# Patient Record
Sex: Male | Born: 2000 | Race: White | Hispanic: No | Marital: Single | Smoking: Current every day smoker
Health system: Southern US, Community
[De-identification: ages and names within clinical notes are randomized; demographics above are authoritative.]

## PROBLEM LIST (undated history)

## (undated) DIAGNOSIS — L729 Follicular cyst of the skin and subcutaneous tissue, unspecified: Secondary | ICD-10-CM

## (undated) DIAGNOSIS — R011 Cardiac murmur, unspecified: Secondary | ICD-10-CM

## (undated) DIAGNOSIS — F909 Attention-deficit hyperactivity disorder, unspecified type: Secondary | ICD-10-CM

## (undated) DIAGNOSIS — F419 Anxiety disorder, unspecified: Secondary | ICD-10-CM

## (undated) HISTORY — DX: Cardiac murmur, unspecified: R01.1

## (undated) HISTORY — DX: Attention-deficit hyperactivity disorder, unspecified type: F90.9

## (undated) HISTORY — DX: Anxiety disorder, unspecified: F41.9

---
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
## 2020-06-24 IMAGING — US US ABDOMEN LIMITED
1 series · 14 of 25 positions shown · non-contrast
Comparison: None.

CLINICAL DATA: Right upper quadrant pain for 1 day

EXAM:
ULTRASOUND ABDOMEN LIMITED RIGHT UPPER QUADRANT

[Series 1: us abdomen limited · 14 of 50 slices shown]
[im 1/50]
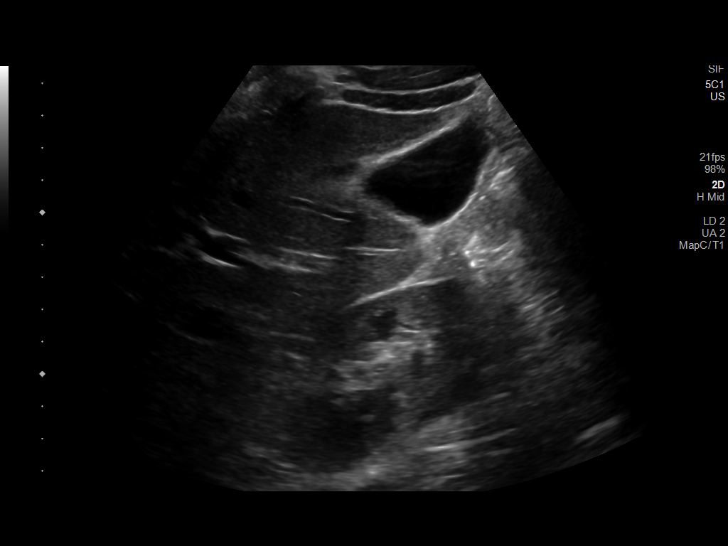
[im 5/50]
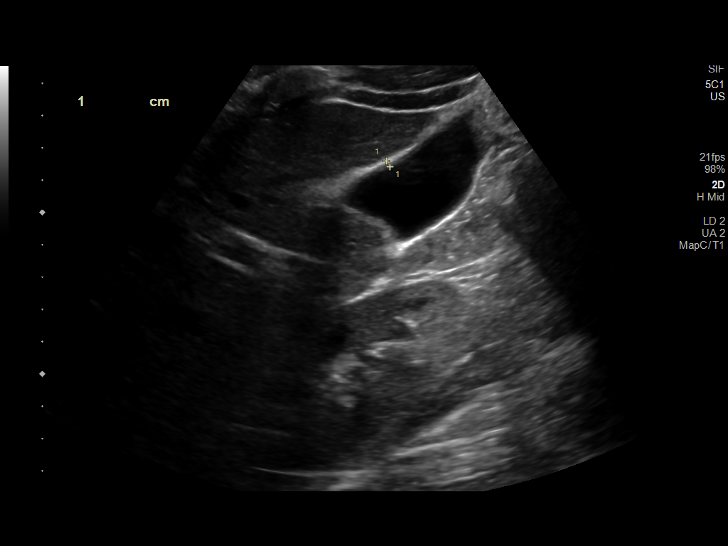
[im 9/50]
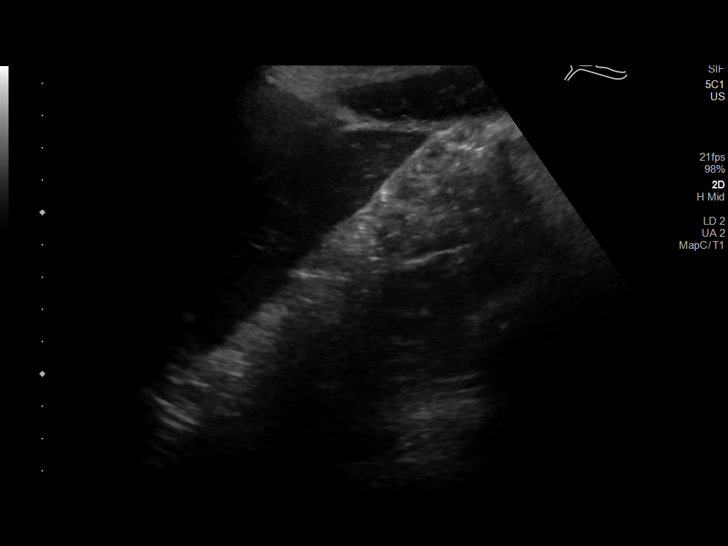
[im 13/50]
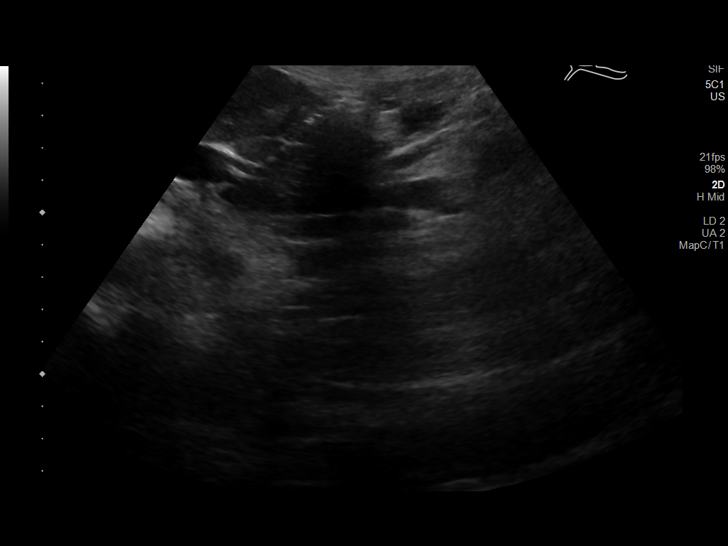
[im 17/50]
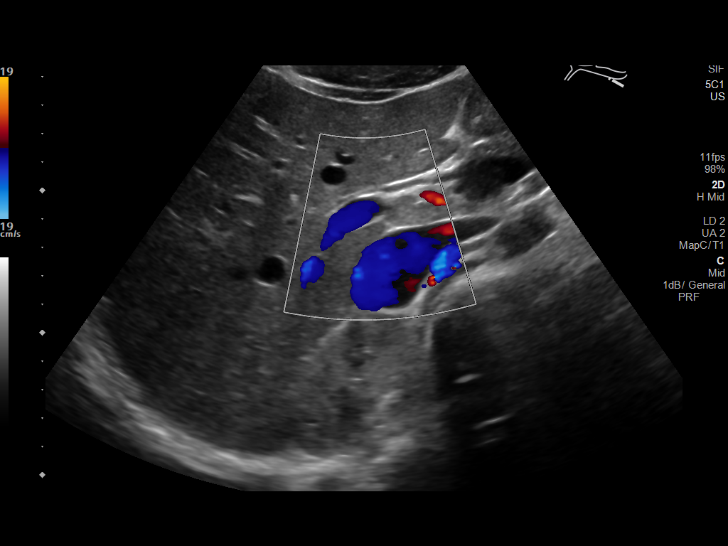
[im 19/50]
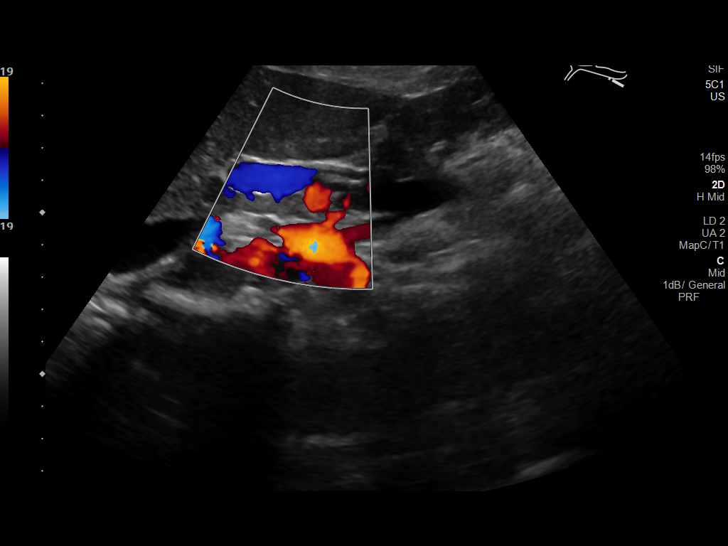
[im 23/50]
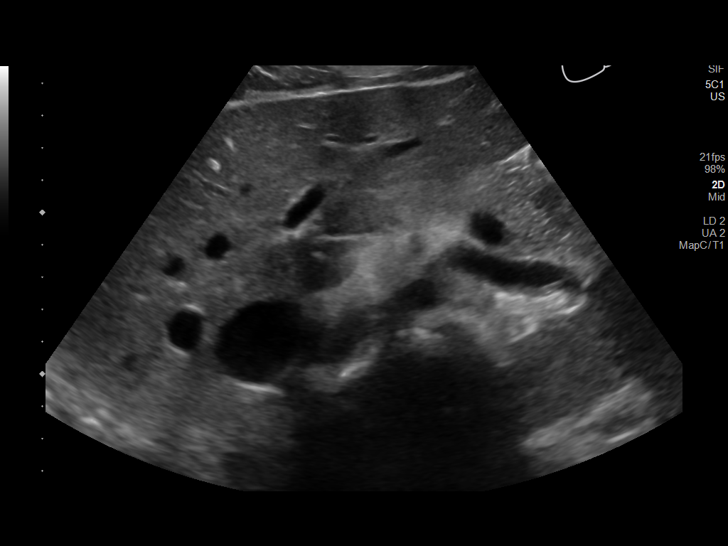
[im 27/50]
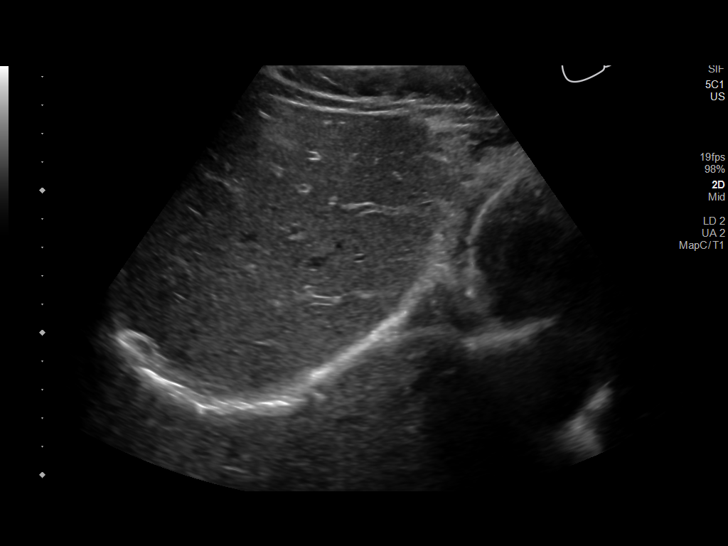
[im 31/50]
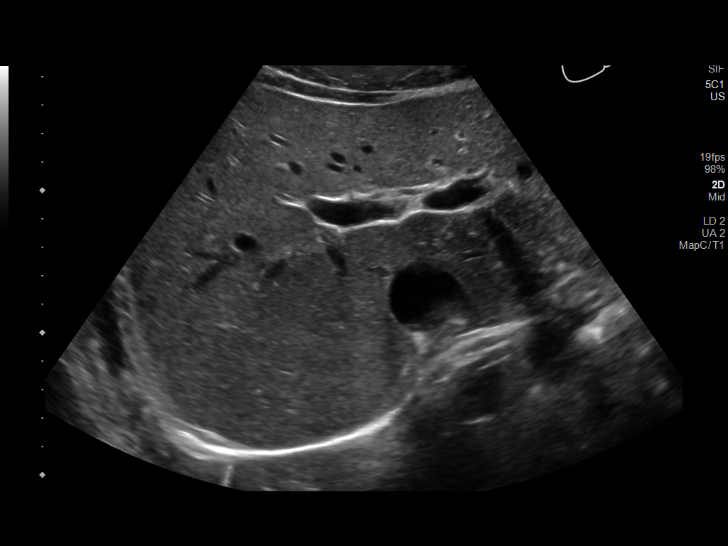
[im 33/50]
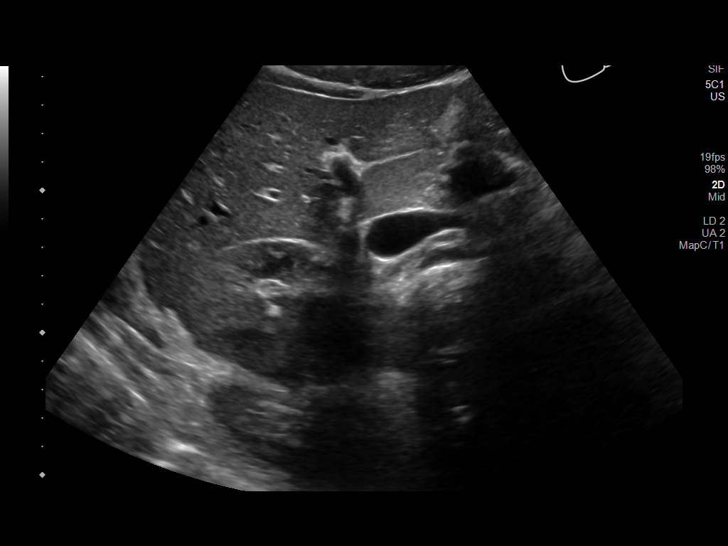
[im 37/50]
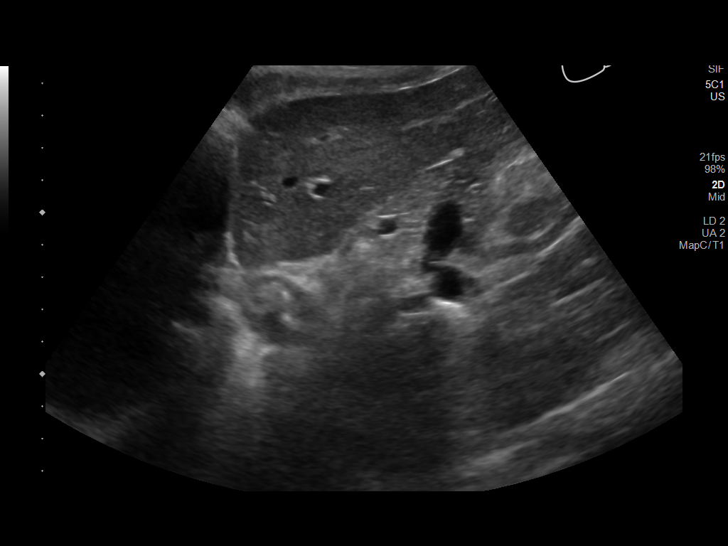
[im 41/50]
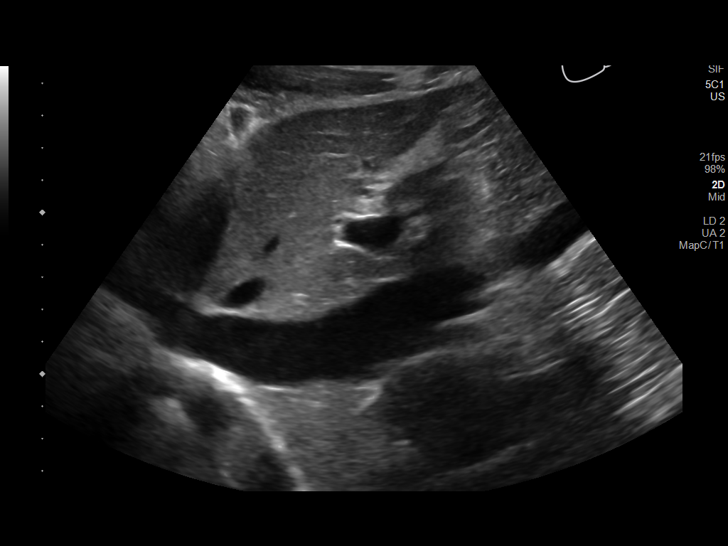
[im 45/50]
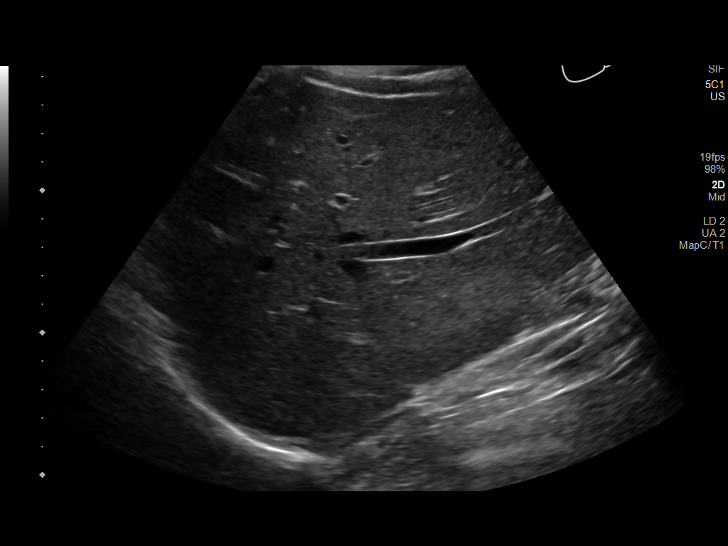
[im 50/50]
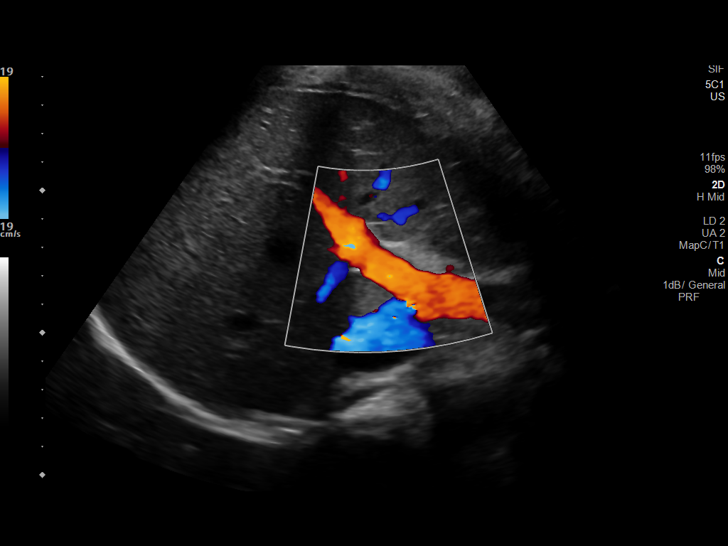

[14 of 25 positions shown; findings below may reference images not displayed]

FINDINGS: Gallbladder:

No gallstones or wall thickening visualized. No sonographic Murphy
sign noted by sonographer.

Common bile duct:

Diameter: 3 mm

Liver:

No focal lesion identified. Within normal limits in parenchymal
echogenicity. Portal vein is patent on color Doppler imaging with
normal direction of blood flow towards the liver.

Other: None.
IMPRESSION: Negative right upper quadrant abdominal ultrasound

## 2022-08-02 ENCOUNTER — Ambulatory Visit: Payer: Medicaid Other | Admitting: Family Medicine

## 2023-08-06 ENCOUNTER — Ambulatory Visit: Payer: Medicaid Other | Admitting: Family Medicine

## 2023-11-23 ENCOUNTER — Observation Stay (HOSPITAL_COMMUNITY)
Admission: EM | Admit: 2023-11-23 | Discharge: 2023-11-24 | Disposition: A | Discharge status: Home / Self Care | Attending: General Surgery | Admitting: General Surgery

## 2023-11-23 ENCOUNTER — Emergency Department (HOSPITAL_COMMUNITY)

## 2023-11-23 ENCOUNTER — Other Ambulatory Visit: Payer: Self-pay

## 2023-11-23 ENCOUNTER — Encounter (HOSPITAL_COMMUNITY): Payer: Self-pay | Admitting: Emergency Medicine

## 2023-11-23 DIAGNOSIS — Y990 Civilian activity done for income or pay: Secondary | ICD-10-CM | POA: Diagnosis not present

## 2023-11-23 DIAGNOSIS — W1789XA Other fall from one level to another, initial encounter: Secondary | ICD-10-CM | POA: Diagnosis not present

## 2023-11-23 DIAGNOSIS — S0083XA Contusion of other part of head, initial encounter: Principal | ICD-10-CM | POA: Insufficient documentation

## 2023-11-23 DIAGNOSIS — W19XXXA Unspecified fall, initial encounter: Secondary | ICD-10-CM

## 2023-11-23 DIAGNOSIS — F17210 Nicotine dependence, cigarettes, uncomplicated: Secondary | ICD-10-CM | POA: Insufficient documentation

## 2023-11-23 DIAGNOSIS — S301XXA Contusion of abdominal wall, initial encounter: Secondary | ICD-10-CM | POA: Diagnosis not present

## 2023-11-23 DIAGNOSIS — S0990XA Unspecified injury of head, initial encounter: Secondary | ICD-10-CM | POA: Diagnosis present

## 2023-11-23 LAB — COMPREHENSIVE METABOLIC PANEL WITH GFR
ALT: 38 U/L (ref 0–44)
AST: 32 U/L (ref 15–41)
Albumin: 3.9 g/dL (ref 3.5–5.0)
Alkaline Phosphatase: 48 U/L (ref 38–126)
Anion gap: 10 (ref 5–15)
BUN: 8 mg/dL (ref 6–20)
CO2: 21 mmol/L — ABNORMAL LOW (ref 22–32)
Calcium: 9 mg/dL (ref 8.9–10.3)
Chloride: 106 mmol/L (ref 98–111)
Creatinine, Ser: 0.9 mg/dL (ref 0.61–1.24)
GFR, Estimated: >60 mL/min (ref >60)
Glucose, Bld: 118 mg/dL — ABNORMAL HIGH (ref 70–99)
Potassium: 3.8 mmol/L (ref 3.5–5.1)
Sodium: 137 mmol/L (ref 135–145)
Total Bilirubin: 0.8 mg/dL (ref 0.0–1.2)
Total Protein: 6.5 g/dL (ref 6.5–8.1)

## 2023-11-23 LAB — I-STAT CHEM 8, ED
BUN: 8 mg/dL (ref 6–20)
Calcium, Ion: 1.07 mmol/L — ABNORMAL LOW (ref 1.15–1.40)
Chloride: 103 mmol/L (ref 98–111)
Creatinine, Ser: 1 mg/dL (ref 0.61–1.24)
Glucose, Bld: 117 mg/dL — ABNORMAL HIGH (ref 70–99)
HCT: 47 % (ref 39.0–52.0)
Hemoglobin: 16 g/dL (ref 13.0–17.0)
Potassium: 3.7 mmol/L (ref 3.5–5.1)
Sodium: 139 mmol/L (ref 135–145)
TCO2: 24 mmol/L (ref 22–32)

## 2023-11-23 LAB — ETHANOL: Alcohol, Ethyl (B): <15 mg/dL (ref <15)

## 2023-11-23 LAB — SAMPLE TO BLOOD BANK

## 2023-11-23 LAB — URINALYSIS, ROUTINE W REFLEX MICROSCOPIC
Bilirubin Urine: NEGATIVE
Glucose, UA: NEGATIVE mg/dL
Hgb urine dipstick: NEGATIVE
Ketones, ur: NEGATIVE mg/dL
Leukocytes,Ua: NEGATIVE
Nitrite: NEGATIVE
Protein, ur: NEGATIVE mg/dL
Specific Gravity, Urine: 1.008 (ref 1.005–1.030)
pH: 7 (ref 5.0–8.0)

## 2023-11-23 LAB — CBC
HCT: 46.6 % (ref 39.0–52.0)
Hemoglobin: 15.7 g/dL (ref 13.0–17.0)
MCH: 30.7 pg (ref 26.0–34.0)
MCHC: 33.7 g/dL (ref 30.0–36.0)
MCV: 91.2 fL (ref 80.0–100.0)
Platelets: 232 K/uL (ref 150–400)
RBC: 5.11 MIL/uL (ref 4.22–5.81)
RDW: 12.7 % (ref 11.5–15.5)
WBC: 13.2 K/uL — ABNORMAL HIGH (ref 4.0–10.5)
nRBC: 0 % (ref 0.0–0.2)

## 2023-11-23 LAB — PROTIME-INR
INR: 1 (ref 0.8–1.2)
Prothrombin Time: 13.4 s (ref 11.4–15.2)

## 2023-11-23 LAB — I-STAT CG4 LACTIC ACID, ED: Lactic Acid, Venous: 1.3 mmol/L (ref 0.5–1.9)

## 2023-11-23 LAB — HIV ANTIBODY (ROUTINE TESTING W REFLEX): HIV Screen 4th Generation wRfx: NONREACTIVE

## 2023-11-23 MED ORDER — SODIUM CHLORIDE 0.9 % IV SOLN: 250.0000 mL | INTRAVENOUS | Status: AC | PRN

## 2023-11-23 MED ORDER — SODIUM CHLORIDE 0.9% FLUSH: 3.0000 mL | INTRAVENOUS | Status: DC | PRN

## 2023-11-23 MED ORDER — LIDOCAINE-EPINEPHRINE (PF) 2 %-1:200000 IJ SOLN
20.0000 mL | Freq: Once | INTRAMUSCULAR | Status: AC
  Administered 2023-11-23: 20 mL
  Filled 2023-11-23: qty 20

## 2023-11-23 MED ORDER — MORPHINE SULFATE (PF) 4 MG/ML IV SOLN
4.0000 mg | Freq: Once | INTRAVENOUS | Status: AC
  Administered 2023-11-23: 4 mg via INTRAVENOUS
  Filled 2023-11-23: qty 1

## 2023-11-23 MED ORDER — METOPROLOL TARTRATE 5 MG/5ML IV SOLN: 5.0000 mg | Freq: Four times a day (QID) | INTRAVENOUS | Status: DC | PRN

## 2023-11-23 MED ORDER — OXYCODONE HCL 5 MG PO TABS
5.0000 mg | ORAL_TABLET | ORAL | Status: DC | PRN
  Administered 2023-11-23 – 2023-11-24 (×3): 5 mg via ORAL
  Filled 2023-11-23 (×3): qty 1

## 2023-11-23 MED ORDER — HYDROMORPHONE HCL 1 MG/ML IJ SOLN
0.5000 mg | INTRAMUSCULAR | Status: DC | PRN
  Administered 2023-11-23 – 2023-11-24 (×4): 0.5 mg via INTRAVENOUS
  Filled 2023-11-23: qty 0.5
  Filled 2023-11-23: qty 1
  Filled 2023-11-23 (×2): qty 0.5

## 2023-11-23 MED ORDER — POLYETHYLENE GLYCOL 3350 17 G PO PACK: 17.0000 g | PACK | Freq: Every day | ORAL | Status: DC | PRN

## 2023-11-23 MED ORDER — METHOCARBAMOL 1000 MG/10ML IJ SOLN: 500.0000 mg | Freq: Three times a day (TID) | INTRAMUSCULAR | Status: DC

## 2023-11-23 MED ORDER — CEFAZOLIN SODIUM-DEXTROSE 2-4 GM/100ML-% IV SOLN
2.0000 g | Freq: Once | INTRAVENOUS | Status: AC
  Administered 2023-11-23: 2 g via INTRAVENOUS
  Filled 2023-11-23: qty 100

## 2023-11-23 MED ORDER — TETANUS-DIPHTH-ACELL PERTUSSIS 5-2.5-18.5 LF-MCG/0.5 IM SUSY
0.5000 mL | PREFILLED_SYRINGE | Freq: Once | INTRAMUSCULAR | Status: AC
  Administered 2023-11-23: 0.5 mL via INTRAMUSCULAR
  Filled 2023-11-23: qty 0.5

## 2023-11-23 MED ORDER — IOHEXOL 350 MG/ML SOLN
75.0000 mL | Freq: Once | INTRAVENOUS | Status: AC | PRN
  Administered 2023-11-23: 75 mL via INTRAVENOUS

## 2023-11-23 MED ORDER — ONDANSETRON 4 MG PO TBDP: 4.0000 mg | ORAL_TABLET | Freq: Four times a day (QID) | ORAL | Status: DC | PRN

## 2023-11-23 MED ORDER — ONDANSETRON HCL 4 MG/2ML IJ SOLN: 4.0000 mg | Freq: Four times a day (QID) | INTRAMUSCULAR | Status: DC | PRN

## 2023-11-23 MED ORDER — METHOCARBAMOL 500 MG PO TABS
500.0000 mg | ORAL_TABLET | Freq: Three times a day (TID) | ORAL | Status: DC
  Administered 2023-11-23 – 2023-11-24 (×3): 500 mg via ORAL
  Filled 2023-11-23 (×3): qty 1

## 2023-11-23 MED ORDER — ACETAMINOPHEN 500 MG PO TABS
1000.0000 mg | ORAL_TABLET | Freq: Four times a day (QID) | ORAL | Status: DC
  Administered 2023-11-23 – 2023-11-24 (×5): 1000 mg via ORAL
  Filled 2023-11-23 (×5): qty 2

## 2023-11-23 MED ORDER — HYDROMORPHONE HCL 1 MG/ML IJ SOLN
1.0000 mg | Freq: Once | INTRAMUSCULAR | Status: AC
  Administered 2023-11-23: 1 mg via INTRAVENOUS
  Filled 2023-11-23: qty 1

## 2023-11-23 MED ORDER — SODIUM CHLORIDE 0.9% FLUSH
3.0000 mL | Freq: Two times a day (BID) | INTRAVENOUS | Status: DC
  Administered 2023-11-23 – 2023-11-24 (×2): 3 mL via INTRAVENOUS

## 2023-11-23 MED ORDER — DOCUSATE SODIUM 100 MG PO CAPS
100.0000 mg | ORAL_CAPSULE | Freq: Two times a day (BID) | ORAL | Status: DC
  Administered 2023-11-23 – 2023-11-24 (×2): 100 mg via ORAL
  Filled 2023-11-23 (×3): qty 1

## 2023-11-23 MED ORDER — ONDANSETRON HCL 4 MG/2ML IJ SOLN
4.0000 mg | Freq: Once | INTRAMUSCULAR | Status: AC
  Administered 2023-11-23: 4 mg via INTRAVENOUS
  Filled 2023-11-23: qty 2

## 2023-11-23 MED ORDER — HYDRALAZINE HCL 20 MG/ML IJ SOLN: 10.0000 mg | INTRAMUSCULAR | Status: DC | PRN

## 2023-11-23 NOTE — ED Notes (Signed)
 Pt to floor with transport. Floor aware.

## 2023-11-23 NOTE — H&P (Addendum)
 Activation and Reason: level II, fall  Primary Survey: airway intact, breath sounds intact, pulses intact  Alexander Cisneros is an 23 y.o. male.  HPI: 23 yo male was working at [employer] when something hit his head. He was up 12 ft and initial story was that he fell 12 ft though he is now uncertain. He has no abdominal pain.  Past Medical History:  Diagnosis Date   ADHD (attention deficit hyperactivity disorder)    no current med.   Anxiety    Benign skin cyst 12/2016   left cheek (face)   Heart murmur     Past Surgical History:  Procedure Laterality Date   CYST EXCISION Left 01/10/2017   Procedure: EXCISION CYST LEFT CHEEK;  Surgeon: Claudius Kaplan, MD;  Location: [facility];  Service: General;  Laterality: Left;    Family History  Problem Relation Age of Onset   Anxiety disorder Mother    Diabetes Maternal Grandmother    Hypertension Maternal Grandmother    Parkinson's disease Paternal Grandfather    Anxiety disorder Father     Social History:  reports that he has been smoking cigarettes. He has never used smokeless tobacco. He reports that he does not currently use drugs after having used the following drugs: Marijuana. He reports that he does not drink alcohol.  Allergies: No Known Allergies  Medications: I have reviewed the patient's current medications.  Results for orders placed or performed during the hospital encounter of 11/23/23 (from the past 48 hours)  Comprehensive metabolic panel     Status: Abnormal   Collection Time: 11/23/23  7:25 AM  Result Value Ref Range   Sodium 137 135 - 145 mmol/L   Potassium 3.8 3.5 - 5.1 mmol/L   Chloride 106 98 - 111 mmol/L   CO2 21 (L) 22 - 32 mmol/L   Glucose, Bld 118 (H) 70 - 99 mg/dL    Comment: Glucose reference range applies only to samples taken after fasting for at least 8 hours.   BUN 8 6 - 20 mg/dL   Creatinine, Ser 9.09 0.61 - 1.24 mg/dL   Calcium  9.0 8.9 - 10.3 mg/dL   Total Protein 6.5 6.5 -  8.1 g/dL   Albumin 3.9 3.5 - 5.0 g/dL   AST 32 15 - 41 U/L   ALT 38 0 - 44 U/L   Alkaline Phosphatase 48 38 - 126 U/L   Total Bilirubin 0.8 0.0 - 1.2 mg/dL   GFR, Estimated >39 >39 mL/min    Comment: (NOTE) Calculated using the CKD-EPI Creatinine Equation (2021)    Anion gap 10 5 - 15    Comment: Performed at [facility], [address] [place], [place] [address]  CBC     Status: Abnormal   Collection Time: 11/23/23  7:25 AM  Result Value Ref Range   WBC 13.2 (H) 4.0 - 10.5 K/uL   RBC 5.11 4.22 - 5.81 MIL/uL   Hemoglobin 15.7 13.0 - 17.0 g/dL   HCT 53.3 60.9 - 47.9 %   MCV 91.2 80.0 - 100.0 fL   MCH 30.7 26.0 - 34.0 pg   MCHC 33.7 30.0 - 36.0 g/dL   RDW 87.2 88.4 - 84.4 %   Platelets 232 150 - 400 K/uL   nRBC 0.0 0.0 - 0.2 %    Comment: Performed at [facility], [address], [place], [place] [address]  Ethanol     Status: None   Collection Time: 11/23/23  7:25 AM  Result Value Ref Range   Alcohol, Ethyl (B) <15 <15 mg/dL    Comment: (NOTE) For medical purposes only. Performed at [facility], [address] [place], [place] [address]   Urinalysis, Routine w reflex microscopic -Urine, Clean Catch     Status: Abnormal   Collection Time: 11/23/23  7:25 AM  Result Value Ref Range   Color, Urine STRAW (A) YELLOW   APPearance CLEAR CLEAR   Specific Gravity, Urine 1.008 1.005 - 1.030   pH 7.0 5.0 - 8.0   Glucose, UA NEGATIVE NEGATIVE mg/dL   Hgb urine dipstick NEGATIVE NEGATIVE   Bilirubin Urine NEGATIVE NEGATIVE   Ketones, ur NEGATIVE NEGATIVE mg/dL   Protein, ur NEGATIVE NEGATIVE mg/dL   Nitrite NEGATIVE NEGATIVE   Leukocytes,Ua NEGATIVE NEGATIVE    Comment: Performed at [facility], [address], [place], [place] [address]  Protime-INR     Status: None   Collection Time: 11/23/23  7:25 AM  Result Value Ref Range   Prothrombin Time 13.4 11.4 - 15.2 seconds   INR 1.0 0.8 - 1.2    Comment: (NOTE) INR goal varies based on device and  disease states. Performed at [facility], [address] [place], [place] [address]   Sample to Blood Bank     Status: None   Collection Time: 11/23/23  7:31 AM  Result Value Ref Range   Blood Bank Specimen SAMPLE AVAILABLE FOR TESTING    Sample Expiration      11/26/2023,2359 Performed at [facility], [address], [place], [place] [address]   I-Stat Chem 8, ED     Status: Abnormal   Collection Time: 11/23/23  7:47 AM  Result Value Ref Range   Sodium 139 135 - 145 mmol/L   Potassium 3.7 3.5 - 5.1 mmol/L   Chloride 103 98 - 111 mmol/L   BUN 8 6 - 20 mg/dL   Creatinine, Ser 8.99 0.61 - 1.24 mg/dL   Glucose, Bld 882 (H) 70 - 99 mg/dL    Comment: Glucose reference range applies only to samples taken after fasting for at least 8 hours.   Calcium , Ion 1.07 (L) 1.15 - 1.40 mmol/L   TCO2 24 22 - 32 mmol/L   Hemoglobin 16.0 13.0 - 17.0 g/dL   HCT 52.9 60.9 - 47.9 %  I-Stat Lactic Acid, ED     Status: None   Collection Time: 11/23/23  7:47 AM  Result Value Ref Range   Lactic Acid, Venous 1.3 0.5 - 1.9 mmol/L      PE Blood pressure (!) 150/84, pulse (!) 53, temperature 98.4 F (36.9 C), temperature source Oral, resp. rate 16, SpO2 100%. Constitutional: NAD; conversant; forehead laceration repaired Eyes: Moist conjunctiva; no lid lag; anicteric; PERRL Neck: Trachea midline; no thyromegaly, no cervicalgia Lungs: Normal respiratory effort; no tactile fremitus CV: RRR; no palpable thrills; no pitting edema GI: Abd soft, NT, ND; no palpable hepatosplenomegaly MSK: unable to assess gait; no clubbing/cyanosis Psychiatric: Appropriate affect; alert and oriented x3 Lymphatic: No palpable cervical or axillary lymphadenopathy   Assessment/Plan: 23 yo male hit in head/fall 12 ft  Front bone fx - pain control, follow up with [facility] in 2 weeks Hepatoduodenal ligament hematoma - observe, clear liquids, repeat CBC in am. I reviewed Ct scan showing hematoma just inferior to  liver and medial to gallbladder, no extrav, small fluid in pelvis  FEN- clear liquids VTE- hold for hematoma ID- no issues  Dispo- obs on med surg   Procedures: none  I reviewed last 24 h vitals and pain scores, last 48 h intake and output, last 24 h labs and trends, and last 24 h imaging results.  This care required high  level of medical decision making.   Herlene Righter Kash [place] 11/23/2023, 12:31 PM

## 2023-11-23 NOTE — ED Triage Notes (Signed)
 Pt had an unwitnessed fall. Pt was supposed to be up about 12 feet up but unsure were he fell from. Pt had LOC.

## 2023-11-23 NOTE — Consult Note (Signed)
 Reason for Consult: Skull fracture, subdural hematoma, subarachnoid hemorrhage Referring Physician: Dr. Charlyn Netter Alexander Cisneros is an 23 y.o. male.  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  The patient was brought to [facility].  A head CT was obtained which demonstrated a right frontal skull fracture with a small amount of subdural hematoma, subarachnoid hemorrhage and pneumocephaly.  A neurosurgical consultation was requested.  He has also had an abnormal CT of the abdomen pelvis and has been seen by trauma surgery.  Presently the patient is accompanied by family members and is in the process of having his forehead stitched.  His only complaint is of generalized neck soreness.  He denies numbness, tingling, seizures, weakness, etc.  He is not anticoagulated.  Past Medical History:  Diagnosis Date   ADHD (attention deficit hyperactivity disorder)    no current med.   Anxiety    Benign skin cyst 12/2016   left cheek (face)   Heart murmur     Past Surgical History:  Procedure Laterality Date   CYST EXCISION Left 01/10/2017   Procedure: EXCISION CYST LEFT CHEEK;  Surgeon: Claudius Kaplan, MD;  Location: [facility];  Service: General;  Laterality: Left;    Family History  Problem Relation Age of Onset   Anxiety disorder Mother    Diabetes Maternal Grandmother    Hypertension Maternal Grandmother    Parkinson's disease Paternal Grandfather    Anxiety disorder Father     Social History:  reports that he has been smoking cigarettes. He has never used smokeless tobacco. He reports that he does not currently use drugs after having used the following drugs: Marijuana. He reports that he does not drink alcohol.  Allergies: No Known Allergies  Medications: I have reviewed the patient's current medications. Prior to  Admission: (Not in a hospital admission)  Scheduled:  sodium chloride  flush  3 mL Intravenous Q12H   Continuous:  sodium chloride      PRN:sodium chloride , sodium chloride  flush Anti-infectives (From admission, onward)    Start     Dose/Rate Route Frequency Ordered Stop   11/23/23 0900  ceFAZolin  (ANCEF ) IVPB 2g/100 mL premix        2 g 200 mL/hr over 30 Minutes Intravenous  Once 11/23/23 0856 11/23/23 0943        Results for orders placed or performed during the hospital encounter of 11/23/23 (from the past 48 hours)  Comprehensive metabolic panel     Status: Abnormal   Collection Time: 11/23/23  7:25 AM  Result Value Ref Range   Sodium 137 135 - 145 mmol/L   Potassium 3.8 3.5 - 5.1 mmol/L   Chloride 106 98 - 111 mmol/L   CO2 21 (L) 22 - 32 mmol/L   Glucose, Bld 118 (H) 70 - 99 mg/dL    Comment: Glucose reference range applies only to samples taken after fasting for at least 8 hours.   BUN 8 6 - 20 mg/dL   Creatinine, Ser 9.09 0.61 - 1.24 mg/dL   Calcium  9.0 8.9 - 10.3 mg/dL   Total Protein 6.5 6.5 - 8.1 g/dL   Albumin 3.9 3.5 - 5.0 g/dL   AST 32 15 - 41 U/L   ALT 38 0 - 44 U/L   Alkaline Phosphatase 48 38 - 126 U/L   Total Bilirubin 0.8  0.0 - 1.2 mg/dL   GFR, Estimated >39 >39 mL/min    Comment: (NOTE) Calculated using the CKD-EPI Creatinine Equation (2021)    Anion gap 10 5 - 15    Comment: Performed at [facility], [address], [place], [place] [address]  CBC     Status: Abnormal   Collection Time: 11/23/23  7:25 AM  Result Value Ref Range   WBC 13.2 (H) 4.0 - 10.5 K/uL   RBC 5.11 4.22 - 5.81 MIL/uL   Hemoglobin 15.7 13.0 - 17.0 g/dL   HCT 53.3 60.9 - 47.9 %   MCV 91.2 80.0 - 100.0 fL   MCH 30.7 26.0 - 34.0 pg   MCHC 33.7 30.0 - 36.0 g/dL   RDW 87.2 88.4 - 84.4 %   Platelets 232 150 - 400 K/uL   nRBC 0.0 0.0 - 0.2 %    Comment: Performed at [facility], [address], [place], [place] [address]  Ethanol     Status: None   Collection  Time: 11/23/23  7:25 AM  Result Value Ref Range   Alcohol, Ethyl (B) <15 <15 mg/dL    Comment: (NOTE) For medical purposes only. Performed at [facility], [address], [place], [place] [address]   Urinalysis, Routine w reflex microscopic -Urine, Clean Catch     Status: Abnormal   Collection Time: 11/23/23  7:25 AM  Result Value Ref Range   Color, Urine STRAW (A) YELLOW   APPearance CLEAR CLEAR   Specific Gravity, Urine 1.008 1.005 - 1.030   pH 7.0 5.0 - 8.0   Glucose, UA NEGATIVE NEGATIVE mg/dL   Hgb urine dipstick NEGATIVE NEGATIVE   Bilirubin Urine NEGATIVE NEGATIVE   Ketones, ur NEGATIVE NEGATIVE mg/dL   Protein, ur NEGATIVE NEGATIVE mg/dL   Nitrite NEGATIVE NEGATIVE   Leukocytes,Ua NEGATIVE NEGATIVE    Comment: Performed at [facility], [address], [place], [place] [address]  Protime-INR     Status: None   Collection Time: 11/23/23  7:25 AM  Result Value Ref Range   Prothrombin Time 13.4 11.4 - 15.2 seconds   INR 1.0 0.8 - 1.2    Comment: (NOTE) INR goal varies based on device and disease states. Performed at [facility], [address] [place], [place] [address]   Sample to Blood Bank     Status: None   Collection Time: 11/23/23  7:31 AM  Result Value Ref Range   Blood Bank Specimen SAMPLE AVAILABLE FOR TESTING    Sample Expiration      11/26/2023,2359 Performed at [facility], [address] [place], [place] [address]   I-Stat Chem 8, ED     Status: Abnormal   Collection Time: 11/23/23  7:47 AM  Result Value Ref Range   Sodium 139 135 - 145 mmol/L   Potassium 3.7 3.5 - 5.1 mmol/L   Chloride 103 98 - 111 mmol/L   BUN 8 6 - 20 mg/dL   Creatinine, Ser 8.99 0.61 - 1.24 mg/dL   Glucose, Bld 882 (H) 70 - 99 mg/dL    Comment: Glucose reference range applies only to samples taken after fasting for at least 8 hours.   Calcium , Ion 1.07 (L) 1.15 - 1.40 mmol/L   TCO2 24 22 - 32 mmol/L   Hemoglobin 16.0 13.0 - 17.0 g/dL   HCT 52.9  60.9 - 47.9 %  I-Stat Lactic Acid, ED     Status:  None   Collection Time: 11/23/23  7:47 AM  Result Value Ref Range   Lactic Acid, Venous 1.3 0.5 - 1.9 mmol/L    CT CHEST ABDOMEN PELVIS W CONTRAST Result Date: 11/23/2023 CLINICAL DATA:  Unwitnessed fall. Patient may have been as high as 12 feet. Blunt poly trauma. EXAM: CT CHEST, ABDOMEN, AND PELVIS WITH CONTRAST TECHNIQUE: Multidetector CT imaging of the chest, abdomen and pelvis was performed following the standard protocol during bolus administration of intravenous contrast. RADIATION DOSE REDUCTION: This exam was performed according to the departmental dose-optimization program which includes automated exposure control, adjustment of the mA and/or kV according to patient size and/or use of iterative reconstruction technique. CONTRAST:  75mL OMNIPAQUE  IOHEXOL  350 MG/ML SOLN COMPARISON:  None Available. FINDINGS: CT CHEST FINDINGS Cardiovascular: The heart size is normal. No substantial pericardial effusion. Mediastinum/Nodes: Subtle soft tissue in the high anterior mediastinum with triangular configuration is consistent with thymic remnant. No evidence for mediastinal edema or hemorrhage. No mediastinal lymphadenopathy. There is no hilar lymphadenopathy. The esophagus has normal imaging features. There is no axillary lymphadenopathy. Lungs/Pleura: No evidence for pneumothorax or pleural effusion. Trace paraseptal emphysema noted right lung apex. No lung contusion. Dependent atelectasis noted in the lung bases, right greater than left. Musculoskeletal: No worrisome lytic or sclerotic osseous abnormality. No evidence for rib fracture. No clavicle or scapular fracture. No sternal fracture. No evidence for thoracic spine fracture CT ABDOMEN PELVIS FINDINGS Hepatobiliary: No evidence for liver laceration or contusion. Intrahepatic biliary duct prominence with mild periportal edema. There is no evidence for gallstones, gallbladder wall thickening, or  pericholecystic fluid. No intrahepatic or extrahepatic biliary dilation. Pancreas: No focal mass lesion. No dilatation of the main duct. No intraparenchymal cyst. No peripancreatic edema. Spleen: No splenomegaly. No suspicious focal mass lesion. Adrenals/Urinary Tract: No adrenal nodule or mass. Kidneys unremarkable. No evidence for hydroureter. The urinary bladder appears normal for the degree of distention. Stomach/Bowel: Stomach is unremarkable. No gastric wall thickening. No evidence of outlet obstruction. Duodenum is normally positioned as is the ligament of Treitz. No small bowel wall thickening. No small bowel dilatation. The terminal ileum is normal. The appendix is not well visualized, but there is no edema or inflammation in the region of the cecal tip to suggest appendicitis. No gross colonic mass. No colonic wall thickening. No gross colonic mass. No colonic wall thickening. Vascular/Lymphatic: No abdominal aortic aneurysm. No abdominal aortic atherosclerotic calcification. There is no gastrohepatic or hepatoduodenal ligament lymphadenopathy. No retroperitoneal or mesenteric lymphadenopathy. No pelvic sidewall lymphadenopathy. Reproductive: The prostate gland and seminal vesicles are unremarkable. Other: There is edema/hemorrhage in the region of the hepatoduodenal ligament in upper abdominal retroperitoneal space (see axial images 64 and 66 of series 6). 3.6 x 2.2 cm heterogeneous soft tissue density medial to the gallbladder on 69/6 could possibly be a lymph node although a contained area of hemorrhage could also have this appearance. (See coronal 59/9). Subtle edema is seen a in the aortocaval space on 72/6. No contrast extravasation in these regions to suggest active or ongoing bleeding at the time of imaging. There is a small amount of free fluid in the pelvis (image 115/6) with attenuation higher than would be expected for simple fluid raising the question of blood products. Musculoskeletal: No  evidence for an acute fracture in the bony anatomy of the pelvis. SI joints and symphysis pubis unremarkable. No evidence for lumbar spine or sacral fracture. IMPRESSION: 1. Edema/hemorrhage in the region of the hepatoduodenal ligament and upper abdominal retroperitoneal  space. 3.6 x 2.2 cm heterogeneous soft tissue density medial to the gallbladder suggests a contained area of hemorrhage. Enlarged lymph node a consideration but considered less likely. No contrast extravasation in these regions to suggest active or ongoing bleeding at the time of imaging. 2. Small amount of free fluid in the pelvis with attenuation higher than would be expected for simple fluid raising the question of blood products. 3. Intrahepatic biliary duct prominence with mild periportal edema. No evidence for liver laceration or contusion. No discernible perihepatic fluid. No evidence for splenic injury. 4. No evidence for an acute fracture in the bony anatomy of the chest, abdomen, or pelvis. 5. No evidence for pneumothorax or pleural effusion. No lung contusion. 6.  Emphysema (ICD10-J43.9). I discussed these findings by telephone with provider Dorn Dec at approximately 0845 hours on 11/23/2023. Electronically Signed   By: Camellia Candle M.D.   On: 11/23/2023 08:46   CT HEAD WO CONTRAST Addendum Date: 11/23/2023 ADDENDUM REPORT: 11/23/2023 08:45 ADDENDUM: Study discussed by telephone with provider JONATHAN GILLIAM on 11/23/2023 at 0830 hours. Electronically Signed   By: VEAR Hurst M.D.   On: 11/23/2023 08:45   Result Date: 11/23/2023 CLINICAL DATA:  23 year old male status post unwitnessed fall. Positive loss of consciousness. EXAM: CT HEAD WITHOUT CONTRAST TECHNIQUE: Contiguous axial images were obtained from the base of the skull through the vertex without intravenous contrast. RADIATION DOSE REDUCTION: This exam was performed according to the departmental dose-optimization program which includes automated exposure control,  adjustment of the mA and/or kV according to patient size and/or use of iterative reconstruction technique. COMPARISON:  Brain MRI 03/22/2020. CT face and cervical spine today reported separately. FINDINGS: Brain: Trace pneumocephalus deep to the right frontal sinus. Trace right anterior inferior frontal gyrus linear subarachnoid hemorrhage. Series 3, image 17 and series 5, image 63). No definite hemorrhagic contusion. Trace extra-axial blood along the anterior falx which could be in the subarachnoid or subdural space. And there is evidence of a 2 mm right anterior convexity subdural hematoma also on series 3, image 21. No other acute intracranial hemorrhage. Normal underlying cerebral volume. No midline shift, ventriculomegaly, mass effect, evidence of mass lesion, intracranial hemorrhage or evidence of cortically based acute infarction. Normal basilar cisterns. Normal gray-white differentiation outside of the right anterior inferior frontal gyrus. Vascular: No suspicious intracranial vascular hyperdensity. Skull: Linear fracture through the right frontal bone including through and through the right frontal sinus, frontoethmoidal recess (series 4, image 31). Fracture tracks cephalad toward the vertex. Underlying normal bone mineralization. No other No acute osseous abnormality identified. Sinuses/Orbits: Hemorrhage in the right frontal sinus, frontoethmoidal recess. Other paranasal sinuses are stable and well aerated. Tympanic cavities and mastoids appear clear. Other: Right forehead soft tissue injury including laceration with trace soft tissue gas. Underlying nondepressed right frontal bone fracture. Orbits soft tissues appear negative. Traumatic Brain Injury Risk Stratification Skull Fracture: Nondisplaced - Moderate/mBIG 2 Subdural Hematoma (SDH): <65mm - mBIG 1 (Right anterior frontal convexity, para falcine) Subarachnoid Hemorrhage [facility]): trace (up to 2 sulci) - Low/mBIG 1 Epidural Hematoma (EDH): No - Low/mBIG  1 Cerebral contusion, intra-axial, intraparenchymal Hemorrhage (IPH): No Intraventricular Hemorrhage (IVH): No - Low/mBIG 1 Midline Shift > 1mm or Edema/effacement of sulci/vents: No - Low/mBIG 1 ---------------------------------------------------- IMPRESSION: 1. Positive for nondepressed anterior right frontal bone fracture which is through and through the right frontal sinus and frontoethmoidal recess. Trace pneumocephalus. Small volume right anterior convexity and para falcine subdural hematoma (less than 4 mm) And trace right anterior  frontal lobe Subarachnoid Hemorrhage. 2. No intracranial mass effect or midline shift. 3. CT face and cervical spine are reported separately. Electronically Signed: By: VEAR Hurst M.D. On: 11/23/2023 08:26   CT ANGIO NECK W OR WO CONTRAST Result Date: 11/23/2023 CLINICAL DATA:  23 year old male status post unwitnessed fall. Positive loss of consciousness. EXAM: CT ANGIOGRAPHY HEAD AND NECK TECHNIQUE: Multidetector CT imaging of the head and neck was performed using the standard protocol during bolus administration of intravenous contrast. Multiplanar CT image reconstructions and MIPs were obtained to evaluate the vascular anatomy. Carotid stenosis measurements (when applicable) are obtained utilizing NASCET criteria, using the distal internal carotid diameter as the denominator. RADIATION DOSE REDUCTION: This exam was performed according to the departmental dose-optimization program which includes automated exposure control, adjustment of the mA and/or kV according to patient size and/or use of iterative reconstruction technique. CONTRAST:  75mL OMNIPAQUE  IOHEXOL  350 MG/ML SOLN COMPARISON:  CT head, Cervical spine CT reported separately. FINDINGS: CTA NECK Skeleton: Reported separately today. Upper chest: Negative. Other neck: Nonvascular neck soft tissue spaces appear within normal limits. Right anterior scalp soft tissue injury in association with nondepressed skull fracture, see  comparison. Aortic arch: 4 vessel arch, left vertebral artery arises directly from the arch. No atherosclerosis. Right carotid system: Dense right subclavian contrast venous streak artifact partially obscures the right brachiocephalic artery bifurcation. Otherwise the cervical right carotid system appears patent and negative. Left carotid system: Patent and negative. Vertebral arteries: Proximal right subclavian artery detail mildly obscured. Right vertebral artery patent and negative. Left vertebral artery arises directly from the aortic arch, otherwise normal origin. Late entry of the left vertebral into the cervical transverse foramen (normal variant series 3, image 256). And slightly non dominant appearance of the left vertebral which is patent and negative to the skull base. CTA HEAD Posterior circulation: More codominant appearance of the distal vertebral arteries, mildly tortuous with otherwise normal V4 segments and vertebrobasilar junction. Patent basilar artery is mildly tortuous. Normal PICA origins. No basilar stenosis. Patent SCA and fetal type bilateral PCA origins. Small P1 segments. Bilateral PCA branches are within normal limits. Anterior circulation: Both ICA siphons are patent and normal. Normal posterior communicating artery origins. Bilateral carotid termini, MCA and ACA origins are patent and normal. Normal anterior communicating artery, bilateral ACA branches. MCA M1 segments and MCA trifurcations are patent without stenosis. Bilateral MCA branches are within normal limits. Venous sinuses: Patent. Anatomic variants: Left vertebral artery arises directly from the aortic arch and is mildly non dominant. Fetal type PCA origins. Review of the MIP images confirms the above findings Review of the MIP images confirms the above findings IMPRESSION: 1. Negative CTA Head and Neck. No traumatic vascular injury identified. 2. CT Head, Face and, Cervical spine arereported separately. Electronically Signed    By: VEAR Hurst M.D.   On: 11/23/2023 08:44   CT CERVICAL SPINE WO CONTRAST Result Date: 11/23/2023 CLINICAL DATA:  23 year old male status post unwitnessed fall. Positive loss of consciousness. EXAM: CT CERVICAL SPINE WITHOUT CONTRAST TECHNIQUE: Multidetector CT imaging of the cervical spine was performed without intravenous contrast. Multiplanar CT image reconstructions were also generated. RADIATION DOSE REDUCTION: This exam was performed according to the departmental dose-optimization program which includes automated exposure control, adjustment of the mA and/or kV according to patient size and/or use of iterative reconstruction technique. COMPARISON:  CT head and face today reported separately. FINDINGS: Alignment: Normal cervical lordosis. Cervicothoracic junction alignment is within normal limits. Bilateral posterior element alignment is within  normal limits. Skull base and vertebrae: Bone mineralization is within normal limits. Visualized skull base is intact. No atlanto-occipital dissociation. C1 and C2 appear intact and aligned. No osseous abnormality identified in the cervical spine. Soft tissues and spinal canal: No prevertebral fluid or swelling. No visible canal hematoma. Negative visible noncontrast neck soft tissues. Disc levels:  Negative. Upper chest: Negative visible noncontrast thoracic inlet. IMPRESSION: Negative CT appearance of the Cervical spine. Electronically Signed   By: VEAR Hurst M.D.   On: 11/23/2023 08:33   CT MAXILLOFACIAL WO CONTRAST Result Date: 11/23/2023 CLINICAL DATA:  23 year old male status post unwitnessed fall. Positive loss of consciousness. EXAM: CT MAXILLOFACIAL WITHOUT CONTRAST TECHNIQUE: Multidetector CT imaging of the maxillofacial structures was performed. Multiplanar CT image reconstructions were also generated. RADIATION DOSE REDUCTION: This exam was performed according to the departmental dose-optimization program which includes automated exposure control,  adjustment of the mA and/or kV according to patient size and/or use of iterative reconstruction technique. COMPARISON:  Head CT today.  Cervical spine CT reported separately. FINDINGS: Osseous: Mandible intact and normally located. Bilateral maxilla, zygoma, pterygoid, and nasal bones are intact. Visible central skull base intact. Linear through and through fracture of the right frontoethmoidal recess and frontal sinus on series 3, image 79, further detailed on head CT today. Orbits: Right orbital roof and orbital walls relatively spared despite the medial right frontal sinus fracture. Globes and intraorbital soft tissues appear intact and normal. Sinuses: Hemorrhage in the right frontal sinus and anterior right ethmoids. Otherwise well aerated bilaterally. Tympanic cavities and mastoids are clear. Soft tissues: Negative visible noncontrast larynx, pharynx, parapharyngeal spaces, retropharyngeal space, sublingual space, submandibular spaces, masticator and parotid spaces. Limited intracranial: Stable to that reported separately. IMPRESSION: 1. Redemonstration of Linear fracture through and through the right frontoethmoidal recess and frontal sinus. Right orbital walls relatively spared. See Head CT reported separately. 2. No other acute fracture or traumatic injury identified in the Face. Electronically Signed   By: VEAR Hurst M.D.   On: 11/23/2023 08:30   DG Pelvis Portable Result Date: 11/23/2023 CLINICAL DATA:  Unwitnessed fall. Patient may have been as high as 12 feet. EXAM: PORTABLE PELVIS 1-2 VIEWS COMPARISON:  None Available. FINDINGS: There is no evidence of pelvic fracture or diastasis. No pelvic bone lesions are seen. IMPRESSION: Negative. Electronically Signed   By: Camellia Candle M.D.   On: 11/23/2023 07:46   DG Chest Port 1 View Result Date: 11/23/2023 CLINICAL DATA:  Unwitnessed fall from potentially as high as 12 feet. EXAM: PORTABLE CHEST 1 VIEW COMPARISON:  02/26/2019 FINDINGS: The lungs are clear  without focal pneumonia, edema, pneumothorax or pleural effusion. The cardiopericardial silhouette is within normal limits for size. No discernible displaced acute rib fracture. Multiple tiny radiodense foreign bodies project over the left lung base, potentially external to the patient. IMPRESSION: 1. No acute cardiopulmonary findings. 2. Multiple tiny radiodense foreign bodies project over the left lung base, potentially external to the patient. Electronically Signed   By: Camellia Candle M.D.   On: 11/23/2023 07:46    ROS: As above he complains of neck pain Blood pressure (!) 147/90, pulse 64, temperature 98.1 F (36.7 C), resp. rate (!) 21, SpO2 100%. Estimated body mass index is 23.84 kg/m as calculated from the following:   Height as of 04/07/19: 5' 10 (1.778 m).   Weight as of 04/07/19: 75.4 kg.  Physical Exam  General: An alert and pleasant 23 year old white male in no apparent distress  HEENT: He is  having his right forehead laceration sutured.  Neck: Unremarkable  Thorax: Symmetric  Abdomen: Soft  Extremities: Unremarkable  Neurologic exam: The patient is alert and oriented x 3.  His vision and hearing are grossly normal.  His motor strength is 5/5 in his bilateral bicep, handgrip, gastrocnemius and dorsiflexors.  Cerebellar function is intact the rapid alternating movements of the upper extremities.  Sensory function is intact to light touch sensation all tested dermatomes bilaterally.  I reviewed the patient's head CT performed today.  It demonstrates a right frontal skull fracture through the right frontal sinus.  There is associated tiny subdural hematoma, subarachnoid hemorrhage, possible cerebral contusion and pneumocephaly.  There is some fluid in the right frontal sinus.  I reviewed the patient's cervical CT.  It is unremarkable.    Assessment/Plan: Skull fracture, tiny subarachnoid hemorrhage, subdural hematoma, cerebral contusion, pneumocephaly: I discussed situation  with the patient and his family.  This does not require any further imaging.  He should follow-up in my office in a week or 2 to well CSF fistula.  I have answered all their questions.  Cervicalgia: Since he complains of neck pain I would suggest a flexion-extension C-spine x-ray before discharge.  Abnormal CT of the abdomen and pelvis: Per trauma  Reyes JONETTA Budge 11/23/2023, 9:56 AM

## 2023-11-23 NOTE — ED Notes (Signed)
MD at bedside with suture cart. 

## 2023-11-23 NOTE — ED Notes (Signed)
 XR at bedside

## 2023-11-23 NOTE — ED Provider Notes (Signed)
 [facility] AT [facility] Provider Note   CSN: [id number] Arrival date & time: 11/23/23  [id number]     Patient presents with: Alexander Cisneros is a 23 y.o. male who had an unwitnessed fall while at work, potentially from approximately 12 feet.  He was performing work on equipment was pulling a lever when he had a fall from a platform, has no recollection of events until after waking on the floor.  He is complaining of severe neck pain, both anterior and posterior, does not have any dyspnea, does arrive with a bandaged wound to the right side of his forehead.  Review of his previous medical history only shows a previous diagnosis of ADHD and anxiety, endorses having taken oxycodone  5 mg prior to work today secondary to back pain.    Fall       Prior to Admission medications   Medication Sig Start Date End Date Taking? Authorizing Provider  ibuprofen  (ADVIL ) 200 MG tablet Take 400-600 mg by mouth every 6 (six) hours as needed for mild pain or moderate pain.    [provider]  famotidine  (PEPCID ) 20 MG tablet Take 1 tablet (20 mg total) by mouth 2 (two) times daily. 02/15/19 03/11/19  Edythe Beckwith, MD    Allergies: Patient has no known allergies.    Review of Systems  Musculoskeletal:  Positive for arthralgias and neck pain.  Skin:  Positive for wound.  Neurological:  Positive for light-headedness.  All other systems reviewed and are negative.   Updated Vital Signs BP (!) 147/90   Pulse 64   Temp 98.1 F (36.7 C)   Resp (!) 21   SpO2 100%   Physical Exam Vitals and nursing note reviewed.  Constitutional:      General: He is awake. He is not in acute distress.    Appearance: Normal appearance. He is well-developed.     Interventions: Cervical collar in place.  HENT:     Head: Normocephalic. Laceration present.      Comments: Approximate 4 to 5 cm laceration to the right side of the forehead with hemostasis achieved prior  to arrival.    Right Ear: Hearing, tympanic membrane, ear canal and external ear normal.     Left Ear: Hearing, tympanic membrane, ear canal and external ear normal.     Nose: Nose normal.     Mouth/Throat:     Mouth: Mucous membranes are moist.     Pharynx: Oropharynx is clear. Uvula midline.  Eyes:     General: Lids are normal. Vision grossly intact. Gaze aligned appropriately. No visual field deficit.    Extraocular Movements: Extraocular movements intact.     Conjunctiva/sclera: Conjunctivae normal.     Pupils: Pupils are equal, round, and reactive to light.  Neck:     Trachea: Trachea and phonation normal.  Cardiovascular:     Rate and Rhythm: Normal rate and regular rhythm.     Pulses: Normal pulses.          Radial pulses are 2+ on the right side and 2+ on the left side.       Femoral pulses are 2+ on the right side and 2+ on the left side.      Dorsalis pedis pulses are 2+ on the right side and 2+ on the left side.       Posterior tibial pulses are 2+ on the right side and 2+ on the left side.  Heart sounds: Normal heart sounds, S1 normal and S2 normal. No murmur heard.    No friction rub. No gallop.  Pulmonary:     Effort: Pulmonary effort is normal.     Breath sounds: Normal breath sounds and air entry.  Chest:     Chest wall: No deformity, swelling, tenderness or crepitus.  Abdominal:     General: Abdomen is flat. Bowel sounds are normal.     Palpations: Abdomen is soft.     Tenderness: There is no abdominal tenderness.  Musculoskeletal:        General: Normal range of motion.     Cervical back: Neck supple. Pain with movement, spinous process tenderness and muscular tenderness present.     Thoracic back: Normal.     Lumbar back: Normal.     Right lower leg: No edema.     Left lower leg: No edema.  Skin:    General: Skin is warm and dry.     Capillary Refill: Capillary refill takes less than 2 seconds.     Findings: Laceration present.      Neurological:      General: No focal deficit present.     Mental Status: He is alert and oriented to person, place, and time. Mental status is at baseline.     GCS: GCS eye subscore is 4. GCS verbal subscore is 5. GCS motor subscore is 6.     Cranial Nerves: No cranial nerve deficit, dysarthria or facial asymmetry.     Sensory: No sensory deficit.     Motor: Motor function is intact. No weakness.     Coordination: Coordination is intact.  Psychiatric:        Mood and Affect: Mood normal.        Behavior: Behavior is cooperative.     (all labs ordered are listed, but only abnormal results are displayed) Labs Reviewed  COMPREHENSIVE METABOLIC PANEL WITH GFR - Abnormal; Notable for the following components:      Result Value   CO2 21 (*)    Glucose, Bld 118 (*)    All other components within normal limits  CBC - Abnormal; Notable for the following components:   WBC 13.2 (*)    All other components within normal limits  URINALYSIS, ROUTINE W REFLEX MICROSCOPIC - Abnormal; Notable for the following components:   Color, Urine STRAW (*)    All other components within normal limits  I-STAT CHEM 8, ED - Abnormal; Notable for the following components:   Glucose, Bld 117 (*)    Calcium , Ion 1.07 (*)    All other components within normal limits  ETHANOL  PROTIME-INR  I-STAT CG4 LACTIC ACID, ED  SAMPLE TO BLOOD BANK    EKG: None  Radiology: CT CHEST ABDOMEN PELVIS W CONTRAST Result Date: 11/23/2023 CLINICAL DATA:  Unwitnessed fall. Patient may have been as high as 12 feet. Blunt poly trauma. EXAM: CT CHEST, ABDOMEN, AND PELVIS WITH CONTRAST TECHNIQUE: Multidetector CT imaging of the chest, abdomen and pelvis was performed following the standard protocol during bolus administration of intravenous contrast. RADIATION DOSE REDUCTION: This exam was performed according to the departmental dose-optimization program which includes automated exposure control, adjustment of the mA and/or kV according to patient size  and/or use of iterative reconstruction technique. CONTRAST:  75mL OMNIPAQUE  IOHEXOL  350 MG/ML SOLN COMPARISON:  None Available. FINDINGS: CT CHEST FINDINGS Cardiovascular: The heart size is normal. No substantial pericardial effusion. Mediastinum/Nodes: Subtle soft tissue in the high anterior mediastinum with  triangular configuration is consistent with thymic remnant. No evidence for mediastinal edema or hemorrhage. No mediastinal lymphadenopathy. There is no hilar lymphadenopathy. The esophagus has normal imaging features. There is no axillary lymphadenopathy. Lungs/Pleura: No evidence for pneumothorax or pleural effusion. Trace paraseptal emphysema noted right lung apex. No lung contusion. Dependent atelectasis noted in the lung bases, right greater than left. Musculoskeletal: No worrisome lytic or sclerotic osseous abnormality. No evidence for rib fracture. No clavicle or scapular fracture. No sternal fracture. No evidence for thoracic spine fracture CT ABDOMEN PELVIS FINDINGS Hepatobiliary: No evidence for liver laceration or contusion. Intrahepatic biliary duct prominence with mild periportal edema. There is no evidence for gallstones, gallbladder wall thickening, or pericholecystic fluid. No intrahepatic or extrahepatic biliary dilation. Pancreas: No focal mass lesion. No dilatation of the main duct. No intraparenchymal cyst. No peripancreatic edema. Spleen: No splenomegaly. No suspicious focal mass lesion. Adrenals/Urinary Tract: No adrenal nodule or mass. Kidneys unremarkable. No evidence for hydroureter. The urinary bladder appears normal for the degree of distention. Stomach/Bowel: Stomach is unremarkable. No gastric wall thickening. No evidence of outlet obstruction. Duodenum is normally positioned as is the ligament of Treitz. No small bowel wall thickening. No small bowel dilatation. The terminal ileum is normal. The appendix is not well visualized, but there is no edema or inflammation in the region of  the cecal tip to suggest appendicitis. No gross colonic mass. No colonic wall thickening. No gross colonic mass. No colonic wall thickening. Vascular/Lymphatic: No abdominal aortic aneurysm. No abdominal aortic atherosclerotic calcification. There is no gastrohepatic or hepatoduodenal ligament lymphadenopathy. No retroperitoneal or mesenteric lymphadenopathy. No pelvic sidewall lymphadenopathy. Reproductive: The prostate gland and seminal vesicles are unremarkable. Other: There is edema/hemorrhage in the region of the hepatoduodenal ligament in upper abdominal retroperitoneal space (see axial images 64 and 66 of series 6). 3.6 x 2.2 cm heterogeneous soft tissue density medial to the gallbladder on 69/6 could possibly be a lymph node although a contained area of hemorrhage could also have this appearance. (See coronal 59/9). Subtle edema is seen a in the aortocaval space on 72/6. No contrast extravasation in these regions to suggest active or ongoing bleeding at the time of imaging. There is a small amount of free fluid in the pelvis (image 115/6) with attenuation higher than would be expected for simple fluid raising the question of blood products. Musculoskeletal: No evidence for an acute fracture in the bony anatomy of the pelvis. SI joints and symphysis pubis unremarkable. No evidence for lumbar spine or sacral fracture. IMPRESSION: 1. Edema/hemorrhage in the region of the hepatoduodenal ligament and upper abdominal retroperitoneal space. 3.6 x 2.2 cm heterogeneous soft tissue density medial to the gallbladder suggests a contained area of hemorrhage. Enlarged lymph node a consideration but considered less likely. No contrast extravasation in these regions to suggest active or ongoing bleeding at the time of imaging. 2. Small amount of free fluid in the pelvis with attenuation higher than would be expected for simple fluid raising the question of blood products. 3. Intrahepatic biliary duct prominence with mild  periportal edema. No evidence for liver laceration or contusion. No discernible perihepatic fluid. No evidence for splenic injury. 4. No evidence for an acute fracture in the bony anatomy of the chest, abdomen, or pelvis. 5. No evidence for pneumothorax or pleural effusion. No lung contusion. 6.  Emphysema (ICD10-J43.9). I discussed these findings by telephone with provider Dorn Dec at approximately 0845 hours on 11/23/2023. Electronically Signed   By: Camellia Minus HERO.D.  On: 11/23/2023 08:46   CT HEAD WO CONTRAST Addendum Date: 11/23/2023 ADDENDUM REPORT: 11/23/2023 08:45 ADDENDUM: Study discussed by telephone with provider Jarquis Walker on 11/23/2023 at 0830 hours. Electronically Signed   By: VEAR Hurst M.D.   On: 11/23/2023 08:45   Result Date: 11/23/2023 CLINICAL DATA:  23 year old male status post unwitnessed fall. Positive loss of consciousness. EXAM: CT HEAD WITHOUT CONTRAST TECHNIQUE: Contiguous axial images were obtained from the base of the skull through the vertex without intravenous contrast. RADIATION DOSE REDUCTION: This exam was performed according to the departmental dose-optimization program which includes automated exposure control, adjustment of the mA and/or kV according to patient size and/or use of iterative reconstruction technique. COMPARISON:  Brain MRI 03/22/2020. CT face and cervical spine today reported separately. FINDINGS: Brain: Trace pneumocephalus deep to the right frontal sinus. Trace right anterior inferior frontal gyrus linear subarachnoid hemorrhage. Series 3, image 17 and series 5, image 63). No definite hemorrhagic contusion. Trace extra-axial blood along the anterior falx which could be in the subarachnoid or subdural space. And there is evidence of a 2 mm right anterior convexity subdural hematoma also on series 3, image 21. No other acute intracranial hemorrhage. Normal underlying cerebral volume. No midline shift, ventriculomegaly, mass effect, evidence of mass  lesion, intracranial hemorrhage or evidence of cortically based acute infarction. Normal basilar cisterns. Normal gray-white differentiation outside of the right anterior inferior frontal gyrus. Vascular: No suspicious intracranial vascular hyperdensity. Skull: Linear fracture through the right frontal bone including through and through the right frontal sinus, frontoethmoidal recess (series 4, image 31). Fracture tracks cephalad toward the vertex. Underlying normal bone mineralization. No other No acute osseous abnormality identified. Sinuses/Orbits: Hemorrhage in the right frontal sinus, frontoethmoidal recess. Other paranasal sinuses are stable and well aerated. Tympanic cavities and mastoids appear clear. Other: Right forehead soft tissue injury including laceration with trace soft tissue gas. Underlying nondepressed right frontal bone fracture. Orbits soft tissues appear negative. Traumatic Brain Injury Risk Stratification Skull Fracture: Nondisplaced - Moderate/mBIG 2 Subdural Hematoma (SDH): <42mm - mBIG 1 (Right anterior frontal convexity, para falcine) Subarachnoid Hemorrhage [facility]): trace (up to 2 sulci) - Low/mBIG 1 Epidural Hematoma (EDH): No - Low/mBIG 1 Cerebral contusion, intra-axial, intraparenchymal Hemorrhage (IPH): No Intraventricular Hemorrhage (IVH): No - Low/mBIG 1 Midline Shift > 1mm or Edema/effacement of sulci/vents: No - Low/mBIG 1 ---------------------------------------------------- IMPRESSION: 1. Positive for nondepressed anterior right frontal bone fracture which is through and through the right frontal sinus and frontoethmoidal recess. Trace pneumocephalus. Small volume right anterior convexity and para falcine subdural hematoma (less than 4 mm) And trace right anterior frontal lobe Subarachnoid Hemorrhage. 2. No intracranial mass effect or midline shift. 3. CT face and cervical spine are reported separately. Electronically Signed: By: VEAR Hurst M.D. On: 11/23/2023 08:26   CT ANGIO NECK W  OR WO CONTRAST Result Date: 11/23/2023 CLINICAL DATA:  23 year old male status post unwitnessed fall. Positive loss of consciousness. EXAM: CT ANGIOGRAPHY HEAD AND NECK TECHNIQUE: Multidetector CT imaging of the head and neck was performed using the standard protocol during bolus administration of intravenous contrast. Multiplanar CT image reconstructions and MIPs were obtained to evaluate the vascular anatomy. Carotid stenosis measurements (when applicable) are obtained utilizing NASCET criteria, using the distal internal carotid diameter as the denominator. RADIATION DOSE REDUCTION: This exam was performed according to the departmental dose-optimization program which includes automated exposure control, adjustment of the mA and/or kV according to patient size and/or use of iterative reconstruction technique. CONTRAST:  75mL OMNIPAQUE  IOHEXOL   350 MG/ML SOLN COMPARISON:  CT head, Cervical spine CT reported separately. FINDINGS: CTA NECK Skeleton: Reported separately today. Upper chest: Negative. Other neck: Nonvascular neck soft tissue spaces appear within normal limits. Right anterior scalp soft tissue injury in association with nondepressed skull fracture, see comparison. Aortic arch: 4 vessel arch, left vertebral artery arises directly from the arch. No atherosclerosis. Right carotid system: Dense right subclavian contrast venous streak artifact partially obscures the right brachiocephalic artery bifurcation. Otherwise the cervical right carotid system appears patent and negative. Left carotid system: Patent and negative. Vertebral arteries: Proximal right subclavian artery detail mildly obscured. Right vertebral artery patent and negative. Left vertebral artery arises directly from the aortic arch, otherwise normal origin. Late entry of the left vertebral into the cervical transverse foramen (normal variant series 3, image 256). And slightly non dominant appearance of the left vertebral which is patent and  negative to the skull base. CTA HEAD Posterior circulation: More codominant appearance of the distal vertebral arteries, mildly tortuous with otherwise normal V4 segments and vertebrobasilar junction. Patent basilar artery is mildly tortuous. Normal PICA origins. No basilar stenosis. Patent SCA and fetal type bilateral PCA origins. Small P1 segments. Bilateral PCA branches are within normal limits. Anterior circulation: Both ICA siphons are patent and normal. Normal posterior communicating artery origins. Bilateral carotid termini, MCA and ACA origins are patent and normal. Normal anterior communicating artery, bilateral ACA branches. MCA M1 segments and MCA trifurcations are patent without stenosis. Bilateral MCA branches are within normal limits. Venous sinuses: Patent. Anatomic variants: Left vertebral artery arises directly from the aortic arch and is mildly non dominant. Fetal type PCA origins. Review of the MIP images confirms the above findings Review of the MIP images confirms the above findings IMPRESSION: 1. Negative CTA Head and Neck. No traumatic vascular injury identified. 2. CT Head, Face and, Cervical spine arereported separately. Electronically Signed   By: VEAR Hurst M.D.   On: 11/23/2023 08:44   CT CERVICAL SPINE WO CONTRAST Result Date: 11/23/2023 CLINICAL DATA:  23 year old male status post unwitnessed fall. Positive loss of consciousness. EXAM: CT CERVICAL SPINE WITHOUT CONTRAST TECHNIQUE: Multidetector CT imaging of the cervical spine was performed without intravenous contrast. Multiplanar CT image reconstructions were also generated. RADIATION DOSE REDUCTION: This exam was performed according to the departmental dose-optimization program which includes automated exposure control, adjustment of the mA and/or kV according to patient size and/or use of iterative reconstruction technique. COMPARISON:  CT head and face today reported separately. FINDINGS: Alignment: Normal cervical lordosis.  Cervicothoracic junction alignment is within normal limits. Bilateral posterior element alignment is within normal limits. Skull base and vertebrae: Bone mineralization is within normal limits. Visualized skull base is intact. No atlanto-occipital dissociation. C1 and C2 appear intact and aligned. No osseous abnormality identified in the cervical spine. Soft tissues and spinal canal: No prevertebral fluid or swelling. No visible canal hematoma. Negative visible noncontrast neck soft tissues. Disc levels:  Negative. Upper chest: Negative visible noncontrast thoracic inlet. IMPRESSION: Negative CT appearance of the Cervical spine. Electronically Signed   By: VEAR Hurst M.D.   On: 11/23/2023 08:33   CT MAXILLOFACIAL WO CONTRAST Result Date: 11/23/2023 CLINICAL DATA:  23 year old male status post unwitnessed fall. Positive loss of consciousness. EXAM: CT MAXILLOFACIAL WITHOUT CONTRAST TECHNIQUE: Multidetector CT imaging of the maxillofacial structures was performed. Multiplanar CT image reconstructions were also generated. RADIATION DOSE REDUCTION: This exam was performed according to the departmental dose-optimization program which includes automated exposure control, adjustment of the mA  and/or kV according to patient size and/or use of iterative reconstruction technique. COMPARISON:  Head CT today.  Cervical spine CT reported separately. FINDINGS: Osseous: Mandible intact and normally located. Bilateral maxilla, zygoma, pterygoid, and nasal bones are intact. Visible central skull base intact. Linear through and through fracture of the right frontoethmoidal recess and frontal sinus on series 3, image 79, further detailed on head CT today. Orbits: Right orbital roof and orbital walls relatively spared despite the medial right frontal sinus fracture. Globes and intraorbital soft tissues appear intact and normal. Sinuses: Hemorrhage in the right frontal sinus and anterior right ethmoids. Otherwise well aerated  bilaterally. Tympanic cavities and mastoids are clear. Soft tissues: Negative visible noncontrast larynx, pharynx, parapharyngeal spaces, retropharyngeal space, sublingual space, submandibular spaces, masticator and parotid spaces. Limited intracranial: Stable to that reported separately. IMPRESSION: 1. Redemonstration of Linear fracture through and through the right frontoethmoidal recess and frontal sinus. Right orbital walls relatively spared. See Head CT reported separately. 2. No other acute fracture or traumatic injury identified in the Face. Electronically Signed   By: VEAR Hurst M.D.   On: 11/23/2023 08:30   DG Pelvis Portable Result Date: 11/23/2023 CLINICAL DATA:  Unwitnessed fall. Patient may have been as high as 12 feet. EXAM: PORTABLE PELVIS 1-2 VIEWS COMPARISON:  None Available. FINDINGS: There is no evidence of pelvic fracture or diastasis. No pelvic bone lesions are seen. IMPRESSION: Negative. Electronically Signed   By: Camellia Candle M.D.   On: 11/23/2023 07:46   DG Chest Port 1 View Result Date: 11/23/2023 CLINICAL DATA:  Unwitnessed fall from potentially as high as 12 feet. EXAM: PORTABLE CHEST 1 VIEW COMPARISON:  02/26/2019 FINDINGS: The lungs are clear without focal pneumonia, edema, pneumothorax or pleural effusion. The cardiopericardial silhouette is within normal limits for size. No discernible displaced acute rib fracture. Multiple tiny radiodense foreign bodies project over the left lung base, potentially external to the patient. IMPRESSION: 1. No acute cardiopulmonary findings. 2. Multiple tiny radiodense foreign bodies project over the left lung base, potentially external to the patient. Electronically Signed   By: Camellia Candle M.D.   On: 11/23/2023 07:46     .Laceration Repair  Date/Time: 11/23/2023 9:57 AM  Performed by: Myriam Dorn BROCKS, PA Authorized by: Myriam Dorn BROCKS, PA   Consent:    Consent obtained:  Verbal   Consent given by:  Patient   Risks, benefits,  and alternatives were discussed: yes     Risks discussed:  Infection, pain, retained foreign body, poor cosmetic result, need for additional repair and poor wound healing   Alternatives discussed:  No treatment, delayed treatment, observation and referral Universal protocol:    Procedure explained and questions answered to patient or proxy's satisfaction: yes     Patient identity confirmed:  Verbally with patient, arm band and hospital-assigned identification number Anesthesia:    Anesthesia method:  Local infiltration   Local anesthetic:  Lidocaine  2% WITH epi Laceration details:    Location:  Scalp   Scalp location:  Frontal   Length (cm):  6   Depth (mm):  3 Pre-procedure details:    Preparation:  Patient was prepped and draped in usual sterile fashion and imaging obtained to evaluate for foreign bodies Exploration:    Limited defect created (wound extended): no     Hemostasis achieved with:  Epinephrine  and direct pressure   Imaging obtained: x-ray     Imaging outcome: foreign body not noted     Wound exploration: entire depth of  wound visualized     Wound extent: underlying fracture     Wound extent: no foreign body     Contaminated: no   Treatment:    Area cleansed with:  Povidone-iodine   Amount of cleaning:  Standard   Irrigation solution:  Sterile saline   Irrigation volume:  250 ml   Irrigation method:  Syringe   Debridement:  None   Undermining:  None   Scar revision: no   Skin repair:    Repair method:  Sutures   Suture size:  5-0   Suture material:  Prolene   Suture technique:  Simple interrupted   Number of sutures:  8 Approximation:    Approximation:  Close Repair type:    Repair type:  Intermediate Post-procedure details:    Dressing:  Antibiotic ointment, non-adherent dressing and sterile dressing   Procedure completion:  Tolerated well, no immediate complications .Critical Care  Performed by: Myriam Dorn BROCKS, PA Authorized by: Myriam Dorn BROCKS, PA   Critical care provider statement:    Critical care time (minutes):  30   Critical care time was exclusive of:  Separately billable procedures and treating other patients and teaching time   Critical care was necessary to treat or prevent imminent or life-threatening deterioration of the following conditions:  Trauma   Critical care was time spent personally by me on the following activities:  Development of treatment plan with patient or surrogate, discussions with consultants, evaluation of patient's response to treatment, examination of patient, obtaining history from patient or surrogate, ordering and performing treatments and interventions, ordering and review of laboratory studies, ordering and review of radiographic studies, pulse oximetry and re-evaluation of patient's condition    Medications Ordered in the ED  sodium chloride  flush (NS) 0.9 % injection 3 mL (3 mLs Intravenous Not Given 11/23/23 0909)  sodium chloride  flush (NS) 0.9 % injection 3 mL (has no administration in time range)  0.9 %  sodium chloride  infusion (has no administration in time range)  morphine  (PF) 4 MG/ML injection 4 mg (4 mg Intravenous Given 11/23/23 0732)  ondansetron  (ZOFRAN ) injection 4 mg (4 mg Intravenous Given 11/23/23 0732)  Tdap (BOOSTRIX ) injection 0.5 mL (0.5 mLs Intramuscular Given 11/23/23 0740)  iohexol  (OMNIPAQUE ) 350 MG/ML injection 75 mL (75 mLs Intravenous Contrast Given 11/23/23 0815)  lidocaine -EPINEPHrine  (XYLOCAINE  W/EPI) 2 %-1:200000 (PF) injection 20 mL (20 mLs Infiltration Given by Other 11/23/23 0831)  HYDROmorphone  (DILAUDID ) injection 1 mg (1 mg Intravenous Given 11/23/23 0828)  ceFAZolin  (ANCEF ) IVPB 2g/100 mL premix (0 g Intravenous Stopped 11/23/23 0943)                                    Medical Decision Making Amount and/or Complexity of Data Reviewed Labs: ordered. Radiology: ordered.  Risk Prescription drug management. Decision regarding hospitalization.   Medical  Decision Making:   Alexander Cisneros is a 23 y.o. male who presented to the ED today with polytrauma post fall detailed above.    Patient placed on continuous vitals and telemetry monitoring while in ED which was reviewed periodically.  Complete initial physical exam performed, notably the patient  was alert and oriented in no apparent distress however visibly uncomfortable.  Physical exam as noted with notable spinal tenderness both midline and paraspinal, laceration to the right side of the forehead with hemostasis prior to arrival.    Reviewed and confirmed nursing documentation for past  medical history, family history, social history.    Initial Assessment:   With the patient's presentation of polytrauma post fall, differential includes potential skull fracture, intracranial bleed, spinal fracture.  Also consider possibility of solid organ injury, perforated viscus, maxillofacial trauma.   Initial Plan:  Provide IV morphine  for pain control. Screening labs including CBC and Metabolic panel to evaluate for infectious or metabolic etiology of disease.  Secondary to trauma, collect i-STAT lactate to evaluate perfusion status. Urinalysis with reflex culture ordered to evaluate for UTI or relevant urologic/nephrologic pathology.  Include UDS secondary to trauma panel. CXR to evaluate for structural/infectious intrathoracic pathology.  Obtain portable imaging of the pelvis secondary to fall, rule out pelvic fracture. Obtain CT of the head, maxillofacial structures, cervical spine, and angiography of the neck secondary to fall and head trauma. Obtain CT scan of the chest, abdomen, pelvis secondary to severe mechanism, rule out further injuries. Objective evaluation as below reviewed   Initial Study Results:   Laboratory  All laboratory results reviewed without evidence of clinically relevant pathology.   Exceptions include: Elevated white count at 13.2  Radiology:  All images reviewed  independently. Agree with radiology report at this time.   CT CHEST ABDOMEN PELVIS W CONTRAST Result Date: 11/23/2023 CLINICAL DATA:  Unwitnessed fall. Patient may have been as high as 12 feet. Blunt poly trauma. EXAM: CT CHEST, ABDOMEN, AND PELVIS WITH CONTRAST TECHNIQUE: Multidetector CT imaging of the chest, abdomen and pelvis was performed following the standard protocol during bolus administration of intravenous contrast. RADIATION DOSE REDUCTION: This exam was performed according to the departmental dose-optimization program which includes automated exposure control, adjustment of the mA and/or kV according to patient size and/or use of iterative reconstruction technique. CONTRAST:  75mL OMNIPAQUE  IOHEXOL  350 MG/ML SOLN COMPARISON:  None Available. FINDINGS: CT CHEST FINDINGS Cardiovascular: The heart size is normal. No substantial pericardial effusion. Mediastinum/Nodes: Subtle soft tissue in the high anterior mediastinum with triangular configuration is consistent with thymic remnant. No evidence for mediastinal edema or hemorrhage. No mediastinal lymphadenopathy. There is no hilar lymphadenopathy. The esophagus has normal imaging features. There is no axillary lymphadenopathy. Lungs/Pleura: No evidence for pneumothorax or pleural effusion. Trace paraseptal emphysema noted right lung apex. No lung contusion. Dependent atelectasis noted in the lung bases, right greater than left. Musculoskeletal: No worrisome lytic or sclerotic osseous abnormality. No evidence for rib fracture. No clavicle or scapular fracture. No sternal fracture. No evidence for thoracic spine fracture CT ABDOMEN PELVIS FINDINGS Hepatobiliary: No evidence for liver laceration or contusion. Intrahepatic biliary duct prominence with mild periportal edema. There is no evidence for gallstones, gallbladder wall thickening, or pericholecystic fluid. No intrahepatic or extrahepatic biliary dilation. Pancreas: No focal mass lesion. No dilatation  of the main duct. No intraparenchymal cyst. No peripancreatic edema. Spleen: No splenomegaly. No suspicious focal mass lesion. Adrenals/Urinary Tract: No adrenal nodule or mass. Kidneys unremarkable. No evidence for hydroureter. The urinary bladder appears normal for the degree of distention. Stomach/Bowel: Stomach is unremarkable. No gastric wall thickening. No evidence of outlet obstruction. Duodenum is normally positioned as is the ligament of Treitz. No small bowel wall thickening. No small bowel dilatation. The terminal ileum is normal. The appendix is not well visualized, but there is no edema or inflammation in the region of the cecal tip to suggest appendicitis. No gross colonic mass. No colonic wall thickening. No gross colonic mass. No colonic wall thickening. Vascular/Lymphatic: No abdominal aortic aneurysm. No abdominal aortic atherosclerotic calcification. There is no gastrohepatic or  hepatoduodenal ligament lymphadenopathy. No retroperitoneal or mesenteric lymphadenopathy. No pelvic sidewall lymphadenopathy. Reproductive: The prostate gland and seminal vesicles are unremarkable. Other: There is edema/hemorrhage in the region of the hepatoduodenal ligament in upper abdominal retroperitoneal space (see axial images 64 and 66 of series 6). 3.6 x 2.2 cm heterogeneous soft tissue density medial to the gallbladder on 69/6 could possibly be a lymph node although a contained area of hemorrhage could also have this appearance. (See coronal 59/9). Subtle edema is seen a in the aortocaval space on 72/6. No contrast extravasation in these regions to suggest active or ongoing bleeding at the time of imaging. There is a small amount of free fluid in the pelvis (image 115/6) with attenuation higher than would be expected for simple fluid raising the question of blood products. Musculoskeletal: No evidence for an acute fracture in the bony anatomy of the pelvis. SI joints and symphysis pubis unremarkable. No evidence  for lumbar spine or sacral fracture. IMPRESSION: 1. Edema/hemorrhage in the region of the hepatoduodenal ligament and upper abdominal retroperitoneal space. 3.6 x 2.2 cm heterogeneous soft tissue density medial to the gallbladder suggests a contained area of hemorrhage. Enlarged lymph node a consideration but considered less likely. No contrast extravasation in these regions to suggest active or ongoing bleeding at the time of imaging. 2. Small amount of free fluid in the pelvis with attenuation higher than would be expected for simple fluid raising the question of blood products. 3. Intrahepatic biliary duct prominence with mild periportal edema. No evidence for liver laceration or contusion. No discernible perihepatic fluid. No evidence for splenic injury. 4. No evidence for an acute fracture in the bony anatomy of the chest, abdomen, or pelvis. 5. No evidence for pneumothorax or pleural effusion. No lung contusion. 6.  Emphysema (ICD10-J43.9). I discussed these findings by telephone with provider Dorn Dec at approximately 0845 hours on 11/23/2023. Electronically Signed   By: Camellia Candle M.D.   On: 11/23/2023 08:46   CT HEAD WO CONTRAST Addendum Date: 11/23/2023 ADDENDUM REPORT: 11/23/2023 08:45 ADDENDUM: Study discussed by telephone with provider Eustacia Urbanek on 11/23/2023 at 0830 hours. Electronically Signed   By: VEAR Hurst M.D.   On: 11/23/2023 08:45   Result Date: 11/23/2023 CLINICAL DATA:  23 year old male status post unwitnessed fall. Positive loss of consciousness. EXAM: CT HEAD WITHOUT CONTRAST TECHNIQUE: Contiguous axial images were obtained from the base of the skull through the vertex without intravenous contrast. RADIATION DOSE REDUCTION: This exam was performed according to the departmental dose-optimization program which includes automated exposure control, adjustment of the mA and/or kV according to patient size and/or use of iterative reconstruction technique. COMPARISON:  Brain MRI  03/22/2020. CT face and cervical spine today reported separately. FINDINGS: Brain: Trace pneumocephalus deep to the right frontal sinus. Trace right anterior inferior frontal gyrus linear subarachnoid hemorrhage. Series 3, image 17 and series 5, image 63). No definite hemorrhagic contusion. Trace extra-axial blood along the anterior falx which could be in the subarachnoid or subdural space. And there is evidence of a 2 mm right anterior convexity subdural hematoma also on series 3, image 21. No other acute intracranial hemorrhage. Normal underlying cerebral volume. No midline shift, ventriculomegaly, mass effect, evidence of mass lesion, intracranial hemorrhage or evidence of cortically based acute infarction. Normal basilar cisterns. Normal gray-white differentiation outside of the right anterior inferior frontal gyrus. Vascular: No suspicious intracranial vascular hyperdensity. Skull: Linear fracture through the right frontal bone including through and through the right frontal sinus, frontoethmoidal recess (  series 4, image 31). Fracture tracks cephalad toward the vertex. Underlying normal bone mineralization. No other No acute osseous abnormality identified. Sinuses/Orbits: Hemorrhage in the right frontal sinus, frontoethmoidal recess. Other paranasal sinuses are stable and well aerated. Tympanic cavities and mastoids appear clear. Other: Right forehead soft tissue injury including laceration with trace soft tissue gas. Underlying nondepressed right frontal bone fracture. Orbits soft tissues appear negative. Traumatic Brain Injury Risk Stratification Skull Fracture: Nondisplaced - Moderate/mBIG 2 Subdural Hematoma (SDH): <15mm - mBIG 1 (Right anterior frontal convexity, para falcine) Subarachnoid Hemorrhage [facility]): trace (up to 2 sulci) - Low/mBIG 1 Epidural Hematoma (EDH): No - Low/mBIG 1 Cerebral contusion, intra-axial, intraparenchymal Hemorrhage (IPH): No Intraventricular Hemorrhage (IVH): No - Low/mBIG 1  Midline Shift > 1mm or Edema/effacement of sulci/vents: No - Low/mBIG 1 ---------------------------------------------------- IMPRESSION: 1. Positive for nondepressed anterior right frontal bone fracture which is through and through the right frontal sinus and frontoethmoidal recess. Trace pneumocephalus. Small volume right anterior convexity and para falcine subdural hematoma (less than 4 mm) And trace right anterior frontal lobe Subarachnoid Hemorrhage. 2. No intracranial mass effect or midline shift. 3. CT face and cervical spine are reported separately. Electronically Signed: By: VEAR Hurst M.D. On: 11/23/2023 08:26   CT ANGIO NECK W OR WO CONTRAST Result Date: 11/23/2023 CLINICAL DATA:  23 year old male status post unwitnessed fall. Positive loss of consciousness. EXAM: CT ANGIOGRAPHY HEAD AND NECK TECHNIQUE: Multidetector CT imaging of the head and neck was performed using the standard protocol during bolus administration of intravenous contrast. Multiplanar CT image reconstructions and MIPs were obtained to evaluate the vascular anatomy. Carotid stenosis measurements (when applicable) are obtained utilizing NASCET criteria, using the distal internal carotid diameter as the denominator. RADIATION DOSE REDUCTION: This exam was performed according to the departmental dose-optimization program which includes automated exposure control, adjustment of the mA and/or kV according to patient size and/or use of iterative reconstruction technique. CONTRAST:  75mL OMNIPAQUE  IOHEXOL  350 MG/ML SOLN COMPARISON:  CT head, Cervical spine CT reported separately. FINDINGS: CTA NECK Skeleton: Reported separately today. Upper chest: Negative. Other neck: Nonvascular neck soft tissue spaces appear within normal limits. Right anterior scalp soft tissue injury in association with nondepressed skull fracture, see comparison. Aortic arch: 4 vessel arch, left vertebral artery arises directly from the arch. No atherosclerosis. Right  carotid system: Dense right subclavian contrast venous streak artifact partially obscures the right brachiocephalic artery bifurcation. Otherwise the cervical right carotid system appears patent and negative. Left carotid system: Patent and negative. Vertebral arteries: Proximal right subclavian artery detail mildly obscured. Right vertebral artery patent and negative. Left vertebral artery arises directly from the aortic arch, otherwise normal origin. Late entry of the left vertebral into the cervical transverse foramen (normal variant series 3, image 256). And slightly non dominant appearance of the left vertebral which is patent and negative to the skull base. CTA HEAD Posterior circulation: More codominant appearance of the distal vertebral arteries, mildly tortuous with otherwise normal V4 segments and vertebrobasilar junction. Patent basilar artery is mildly tortuous. Normal PICA origins. No basilar stenosis. Patent SCA and fetal type bilateral PCA origins. Small P1 segments. Bilateral PCA branches are within normal limits. Anterior circulation: Both ICA siphons are patent and normal. Normal posterior communicating artery origins. Bilateral carotid termini, MCA and ACA origins are patent and normal. Normal anterior communicating artery, bilateral ACA branches. MCA M1 segments and MCA trifurcations are patent without stenosis. Bilateral MCA branches are within normal limits. Venous sinuses: Patent. Anatomic variants: Left vertebral  artery arises directly from the aortic arch and is mildly non dominant. Fetal type PCA origins. Review of the MIP images confirms the above findings Review of the MIP images confirms the above findings IMPRESSION: 1. Negative CTA Head and Neck. No traumatic vascular injury identified. 2. CT Head, Face and, Cervical spine arereported separately. Electronically Signed   By: VEAR Hurst M.D.   On: 11/23/2023 08:44   CT CERVICAL SPINE WO CONTRAST Result Date: 11/23/2023 CLINICAL DATA:   23 year old male status post unwitnessed fall. Positive loss of consciousness. EXAM: CT CERVICAL SPINE WITHOUT CONTRAST TECHNIQUE: Multidetector CT imaging of the cervical spine was performed without intravenous contrast. Multiplanar CT image reconstructions were also generated. RADIATION DOSE REDUCTION: This exam was performed according to the departmental dose-optimization program which includes automated exposure control, adjustment of the mA and/or kV according to patient size and/or use of iterative reconstruction technique. COMPARISON:  CT head and face today reported separately. FINDINGS: Alignment: Normal cervical lordosis. Cervicothoracic junction alignment is within normal limits. Bilateral posterior element alignment is within normal limits. Skull base and vertebrae: Bone mineralization is within normal limits. Visualized skull base is intact. No atlanto-occipital dissociation. C1 and C2 appear intact and aligned. No osseous abnormality identified in the cervical spine. Soft tissues and spinal canal: No prevertebral fluid or swelling. No visible canal hematoma. Negative visible noncontrast neck soft tissues. Disc levels:  Negative. Upper chest: Negative visible noncontrast thoracic inlet. IMPRESSION: Negative CT appearance of the Cervical spine. Electronically Signed   By: VEAR Hurst M.D.   On: 11/23/2023 08:33   CT MAXILLOFACIAL WO CONTRAST Result Date: 11/23/2023 CLINICAL DATA:  23 year old male status post unwitnessed fall. Positive loss of consciousness. EXAM: CT MAXILLOFACIAL WITHOUT CONTRAST TECHNIQUE: Multidetector CT imaging of the maxillofacial structures was performed. Multiplanar CT image reconstructions were also generated. RADIATION DOSE REDUCTION: This exam was performed according to the departmental dose-optimization program which includes automated exposure control, adjustment of the mA and/or kV according to patient size and/or use of iterative reconstruction technique. COMPARISON:  Head  CT today.  Cervical spine CT reported separately. FINDINGS: Osseous: Mandible intact and normally located. Bilateral maxilla, zygoma, pterygoid, and nasal bones are intact. Visible central skull base intact. Linear through and through fracture of the right frontoethmoidal recess and frontal sinus on series 3, image 79, further detailed on head CT today. Orbits: Right orbital roof and orbital walls relatively spared despite the medial right frontal sinus fracture. Globes and intraorbital soft tissues appear intact and normal. Sinuses: Hemorrhage in the right frontal sinus and anterior right ethmoids. Otherwise well aerated bilaterally. Tympanic cavities and mastoids are clear. Soft tissues: Negative visible noncontrast larynx, pharynx, parapharyngeal spaces, retropharyngeal space, sublingual space, submandibular spaces, masticator and parotid spaces. Limited intracranial: Stable to that reported separately. IMPRESSION: 1. Redemonstration of Linear fracture through and through the right frontoethmoidal recess and frontal sinus. Right orbital walls relatively spared. See Head CT reported separately. 2. No other acute fracture or traumatic injury identified in the Face. Electronically Signed   By: VEAR Hurst M.D.   On: 11/23/2023 08:30   DG Pelvis Portable Result Date: 11/23/2023 CLINICAL DATA:  Unwitnessed fall. Patient may have been as high as 12 feet. EXAM: PORTABLE PELVIS 1-2 VIEWS COMPARISON:  None Available. FINDINGS: There is no evidence of pelvic fracture or diastasis. No pelvic bone lesions are seen. IMPRESSION: Negative. Electronically Signed   By: Camellia Candle M.D.   On: 11/23/2023 07:46   DG Chest Port 1 View Result Date: 11/23/2023  CLINICAL DATA:  Unwitnessed fall from potentially as high as 12 feet. EXAM: PORTABLE CHEST 1 VIEW COMPARISON:  02/26/2019 FINDINGS: The lungs are clear without focal pneumonia, edema, pneumothorax or pleural effusion. The cardiopericardial silhouette is within normal limits  for size. No discernible displaced acute rib fracture. Multiple tiny radiodense foreign bodies project over the left lung base, potentially external to the patient. IMPRESSION: 1. No acute cardiopulmonary findings. 2. Multiple tiny radiodense foreign bodies project over the left lung base, potentially external to the patient. Electronically Signed   By: Camellia Candle M.D.   On: 11/23/2023 07:46      Consults: Case discussed with Dr Jennetta w/ neurosurgery.  Dr Dallie w/ trauma surgery.   Reassessment and Plan:   Wound closure as noted in the procedure note.  This patient had CT evidence of a linear fracture through the right frontoethmoidal recess and frontal sinus with right orbital walls relatively spared.  CT of the abdomen demonstrated edema/hemorrhage in the region of the hepatoduodenal ligament, further contained hemorrhage near the gallbladder is also noted.  Consulted with both neurosurgery and trauma surgery.  As noted in the neurosurgical note, he will be followed up on as an outpatient by neurosurgery given the findings on CT exam and loss of consciousness.  Further, with abdominal findings, patient will be admitted by trauma service for observation regarding the findings of of retroperitoneal bleed.  This was discussed with the patient, they understand and agree.  Secondary to open fracture of the skull, patient was started on Ancef  for wound prophylaxis.  Further pain control as noted.       Final diagnoses:  Injury of head, initial encounter  Contusion of face, initial encounter  Fall, initial encounter    ED Discharge Orders     None          Myriam Dorn [place], [place] 11/23/23 1242    Charlyn Sora, MD 11/24/23 1544

## 2023-11-23 NOTE — ED Notes (Signed)
 Patient transported to X-ray

## 2023-11-24 LAB — CBC
HCT: 48.8 % (ref 39.0–52.0)
Hemoglobin: 16.5 g/dL (ref 13.0–17.0)
MCH: 30.4 pg (ref 26.0–34.0)
MCHC: 33.8 g/dL (ref 30.0–36.0)
MCV: 90 fL (ref 80.0–100.0)
Platelets: 252 K/uL (ref 150–400)
RBC: 5.42 MIL/uL (ref 4.22–5.81)
RDW: 13 % (ref 11.5–15.5)
WBC: 11 K/uL — ABNORMAL HIGH (ref 4.0–10.5)
nRBC: 0 % (ref 0.0–0.2)

## 2023-11-24 LAB — BASIC METABOLIC PANEL WITH GFR
Anion gap: 9 (ref 5–15)
BUN: <5 mg/dL — ABNORMAL LOW (ref 6–20)
CO2: 23 mmol/L (ref 22–32)
Calcium: 9.4 mg/dL (ref 8.9–10.3)
Chloride: 104 mmol/L (ref 98–111)
Creatinine, Ser: 1.05 mg/dL (ref 0.61–1.24)
GFR, Estimated: >60 mL/min (ref >60)
Glucose, Bld: 96 mg/dL (ref 70–99)
Potassium: 4 mmol/L (ref 3.5–5.1)
Sodium: 136 mmol/L (ref 135–145)

## 2023-11-24 MED ORDER — METHOCARBAMOL 500 MG PO TABS
1000.0000 mg | ORAL_TABLET | Freq: Three times a day (TID) | ORAL | Status: DC
  Administered 2023-11-24: 1000 mg via ORAL
  Filled 2023-11-24: qty 2

## 2023-11-24 MED ORDER — OXYCODONE HCL 5 MG PO TABS
5.0000 mg | ORAL_TABLET | Freq: Four times a day (QID) | ORAL | 0 refills | Status: DC | PRN
Start: 2023-11-24 — End: 2023-12-20

## 2023-11-24 MED ORDER — OXYCODONE HCL 5 MG PO TABS
5.0000 mg | ORAL_TABLET | ORAL | Status: DC | PRN
  Administered 2023-11-24 (×2): 10 mg via ORAL
  Filled 2023-11-24 (×2): qty 2

## 2023-11-24 MED ORDER — IBUPROFEN 800 MG PO TABS
800.0000 mg | ORAL_TABLET | Freq: Three times a day (TID) | ORAL | 0 refills | Status: DC | PRN
Start: 2023-11-24 — End: 2023-12-20

## 2023-11-24 MED ORDER — METHOCARBAMOL 500 MG PO TABS: 500.0000 mg | ORAL_TABLET | Freq: Three times a day (TID) | ORAL | 0 refills | Status: DC | PRN

## 2023-11-24 MED ORDER — LIDOCAINE 5 % EX PTCH
1.0000 | MEDICATED_PATCH | CUTANEOUS | Status: DC
  Administered 2023-11-24: 1 via TRANSDERMAL
  Filled 2023-11-24: qty 1

## 2023-11-24 MED ORDER — LIDOCAINE 5 % EX PTCH: 1.0000 | MEDICATED_PATCH | CUTANEOUS | 0 refills | Status: AC

## 2023-11-24 MED ORDER — HYDROMORPHONE HCL 1 MG/ML IJ SOLN: 0.5000 mg | INTRAMUSCULAR | Status: DC | PRN

## 2023-11-24 NOTE — Discharge Summary (Signed)
 Physician Discharge Summary  Alexander Cisneros FMW:[id number] DOB: 2001/01/08 DOA: 11/23/2023  PCP: Joshua Santana CROME, NP  Admit date: 11/23/2023 Discharge date:  11/24/2023   Recommendations for Outpatient Follow-up:   (include homehealth, outpatient follow-up instructions, specific recommendations for PCP to follow-up on, etc.)   Follow-up Information     Mavis Purchase, MD. Schedule an appointment as soon as possible for a visit in 3 week(s).   Specialty: Neurosurgery Contact information: [address] [place] [address] [phone]                Discharge Diagnoses:  Principal Problem:   Abdominal hematoma   Surgical Procedure: laceration repair  Discharge Condition: Good Disposition: Home  Diet recommendation: reg diet   Hospital Course:  23 yo male came in after being hit by metal at work. He was found to have a perihepatic hematoma and frontal bone fx with small SAH. He was admitted to the hospital. He did well and was discharged home HD 2  Discharge Instructions  Discharge Instructions     Diet - low sodium heart healthy   Complete by: As directed    Discharge wound care:   Complete by: As directed    Shower normal tomorrow. Glue to stay on for 10-14 days. No bandage needed.   Increase activity slowly   Complete by: As directed       Allergies as of 11/24/2023   No Known Allergies      Medication List     TAKE these medications    ibuprofen  800 MG tablet Commonly known as: ADVIL  Take 1 tablet (800 mg total) by mouth every 8 (eight) hours as needed.   oxyCODONE  5 MG immediate release tablet Commonly known as: Oxy IR/ROXICODONE  Take 1 tablet (5 mg total) by mouth every 6 (six) hours as needed for severe pain (pain score 7-10).               Discharge Care Instructions  (From admission, onward)           Start     Ordered   11/24/23 0000  Discharge wound care:       Comments: Shower normal tomorrow.  Glue to stay on for 10-14 days. No bandage needed.   11/24/23 1406            Follow-up Information     Mavis Purchase, MD. Schedule an appointment as soon as possible for a visit in 3 week(s).   Specialty: Neurosurgery Contact information: [address] [place] [address] [phone]                  The results of significant diagnostics from this hospitalization (including imaging, microbiology, ancillary and laboratory) are listed below for reference.    Significant Diagnostic Studies: DG Cervical Spine With Flex & Extend Result Date: 11/23/2023 CLINICAL DATA:  Neck pain. EXAM: CERVICAL SPINE COMPLETE WITH FLEXION AND EXTENSION VIEWS COMPARISON:  None Available. FINDINGS: No acute fracture or subluxation. No listhesis on flexion or extension. The vertebral body heights and disc spaces are maintained. The visualized posterior elements and odontoid are intact. There is anatomic alignment of the lateral masses of C1 and C2. The soft tissues are unremarkable. IMPRESSION: Negative cervical spine radiographs. Electronically Signed   By: Vanetta Chou M.D.   On: 11/23/2023 11:51   CT CHEST ABDOMEN PELVIS W CONTRAST Result Date: 11/23/2023 CLINICAL DATA:  Unwitnessed fall. Patient may have been as high  as 12 feet. Blunt poly trauma. EXAM: CT CHEST, ABDOMEN, AND PELVIS WITH CONTRAST TECHNIQUE: Multidetector CT imaging of the chest, abdomen and pelvis was performed following the standard protocol during bolus administration of intravenous contrast. RADIATION DOSE REDUCTION: This exam was performed according to the departmental dose-optimization program which includes automated exposure control, adjustment of the mA and/or kV according to patient size and/or use of iterative reconstruction technique. CONTRAST:  75mL OMNIPAQUE  IOHEXOL  350 MG/ML SOLN COMPARISON:  None Available. FINDINGS: CT CHEST FINDINGS Cardiovascular: The heart size is normal. No substantial  pericardial effusion. Mediastinum/Nodes: Subtle soft tissue in the high anterior mediastinum with triangular configuration is consistent with thymic remnant. No evidence for mediastinal edema or hemorrhage. No mediastinal lymphadenopathy. There is no hilar lymphadenopathy. The esophagus has normal imaging features. There is no axillary lymphadenopathy. Lungs/Pleura: No evidence for pneumothorax or pleural effusion. Trace paraseptal emphysema noted right lung apex. No lung contusion. Dependent atelectasis noted in the lung bases, right greater than left. Musculoskeletal: No worrisome lytic or sclerotic osseous abnormality. No evidence for rib fracture. No clavicle or scapular fracture. No sternal fracture. No evidence for thoracic spine fracture CT ABDOMEN PELVIS FINDINGS Hepatobiliary: No evidence for liver laceration or contusion. Intrahepatic biliary duct prominence with mild periportal edema. There is no evidence for gallstones, gallbladder wall thickening, or pericholecystic fluid. No intrahepatic or extrahepatic biliary dilation. Pancreas: No focal mass lesion. No dilatation of the main duct. No intraparenchymal cyst. No peripancreatic edema. Spleen: No splenomegaly. No suspicious focal mass lesion. Adrenals/Urinary Tract: No adrenal nodule or mass. Kidneys unremarkable. No evidence for hydroureter. The urinary bladder appears normal for the degree of distention. Stomach/Bowel: Stomach is unremarkable. No gastric wall thickening. No evidence of outlet obstruction. Duodenum is normally positioned as is the ligament of Treitz. No small bowel wall thickening. No small bowel dilatation. The terminal ileum is normal. The appendix is not well visualized, but there is no edema or inflammation in the region of the cecal tip to suggest appendicitis. No gross colonic mass. No colonic wall thickening. No gross colonic mass. No colonic wall thickening. Vascular/Lymphatic: No abdominal aortic aneurysm. No abdominal aortic  atherosclerotic calcification. There is no gastrohepatic or hepatoduodenal ligament lymphadenopathy. No retroperitoneal or mesenteric lymphadenopathy. No pelvic sidewall lymphadenopathy. Reproductive: The prostate gland and seminal vesicles are unremarkable. Other: There is edema/hemorrhage in the region of the hepatoduodenal ligament in upper abdominal retroperitoneal space (see axial images 64 and 66 of series 6). 3.6 x 2.2 cm heterogeneous soft tissue density medial to the gallbladder on 69/6 could possibly be a lymph node although a contained area of hemorrhage could also have this appearance. (See coronal 59/9). Subtle edema is seen a in the aortocaval space on 72/6. No contrast extravasation in these regions to suggest active or ongoing bleeding at the time of imaging. There is a small amount of free fluid in the pelvis (image 115/6) with attenuation higher than would be expected for simple fluid raising the question of blood products. Musculoskeletal: No evidence for an acute fracture in the bony anatomy of the pelvis. SI joints and symphysis pubis unremarkable. No evidence for lumbar spine or sacral fracture. IMPRESSION: 1. Edema/hemorrhage in the region of the hepatoduodenal ligament and upper abdominal retroperitoneal space. 3.6 x 2.2 cm heterogeneous soft tissue density medial to the gallbladder suggests a contained area of hemorrhage. Enlarged lymph node a consideration but considered less likely. No contrast extravasation in these regions to suggest active or ongoing bleeding at the time of imaging. 2. Small  amount of free fluid in the pelvis with attenuation higher than would be expected for simple fluid raising the question of blood products. 3. Intrahepatic biliary duct prominence with mild periportal edema. No evidence for liver laceration or contusion. No discernible perihepatic fluid. No evidence for splenic injury. 4. No evidence for an acute fracture in the bony anatomy of the chest, abdomen, or  pelvis. 5. No evidence for pneumothorax or pleural effusion. No lung contusion. 6.  Emphysema (ICD10-J43.9). I discussed these findings by telephone with provider Dorn Dec at approximately 0845 hours on 11/23/2023. Electronically Signed   By: Camellia Candle M.D.   On: 11/23/2023 08:46   CT HEAD WO CONTRAST Addendum Date: 11/23/2023 ADDENDUM REPORT: 11/23/2023 08:45 ADDENDUM: Study discussed by telephone with provider JONATHAN GILLIAM on 11/23/2023 at 0830 hours. Electronically Signed   By: VEAR Hurst M.D.   On: 11/23/2023 08:45   Result Date: 11/23/2023 CLINICAL DATA:  23 year old male status post unwitnessed fall. Positive loss of consciousness. EXAM: CT HEAD WITHOUT CONTRAST TECHNIQUE: Contiguous axial images were obtained from the base of the skull through the vertex without intravenous contrast. RADIATION DOSE REDUCTION: This exam was performed according to the departmental dose-optimization program which includes automated exposure control, adjustment of the mA and/or kV according to patient size and/or use of iterative reconstruction technique. COMPARISON:  Brain MRI 03/22/2020. CT face and cervical spine today reported separately. FINDINGS: Brain: Trace pneumocephalus deep to the right frontal sinus. Trace right anterior inferior frontal gyrus linear subarachnoid hemorrhage. Series 3, image 17 and series 5, image 63). No definite hemorrhagic contusion. Trace extra-axial blood along the anterior falx which could be in the subarachnoid or subdural space. And there is evidence of a 2 mm right anterior convexity subdural hematoma also on series 3, image 21. No other acute intracranial hemorrhage. Normal underlying cerebral volume. No midline shift, ventriculomegaly, mass effect, evidence of mass lesion, intracranial hemorrhage or evidence of cortically based acute infarction. Normal basilar cisterns. Normal gray-white differentiation outside of the right anterior inferior frontal gyrus. Vascular: No  suspicious intracranial vascular hyperdensity. Skull: Linear fracture through the right frontal bone including through and through the right frontal sinus, frontoethmoidal recess (series 4, image 31). Fracture tracks cephalad toward the vertex. Underlying normal bone mineralization. No other No acute osseous abnormality identified. Sinuses/Orbits: Hemorrhage in the right frontal sinus, frontoethmoidal recess. Other paranasal sinuses are stable and well aerated. Tympanic cavities and mastoids appear clear. Other: Right forehead soft tissue injury including laceration with trace soft tissue gas. Underlying nondepressed right frontal bone fracture. Orbits soft tissues appear negative. Traumatic Brain Injury Risk Stratification Skull Fracture: Nondisplaced - Moderate/mBIG 2 Subdural Hematoma (SDH): <84mm - mBIG 1 (Right anterior frontal convexity, para falcine) Subarachnoid Hemorrhage [facility]): trace (up to 2 sulci) - Low/mBIG 1 Epidural Hematoma (EDH): No - Low/mBIG 1 Cerebral contusion, intra-axial, intraparenchymal Hemorrhage (IPH): No Intraventricular Hemorrhage (IVH): No - Low/mBIG 1 Midline Shift > 1mm or Edema/effacement of sulci/vents: No - Low/mBIG 1 ---------------------------------------------------- IMPRESSION: 1. Positive for nondepressed anterior right frontal bone fracture which is through and through the right frontal sinus and frontoethmoidal recess. Trace pneumocephalus. Small volume right anterior convexity and para falcine subdural hematoma (less than 4 mm) And trace right anterior frontal lobe Subarachnoid Hemorrhage. 2. No intracranial mass effect or midline shift. 3. CT face and cervical spine are reported separately. Electronically Signed: By: VEAR Hurst M.D. On: 11/23/2023 08:26   CT ANGIO NECK W OR WO CONTRAST Result Date: 11/23/2023 CLINICAL DATA:  23 year old  male status post unwitnessed fall. Positive loss of consciousness. EXAM: CT ANGIOGRAPHY HEAD AND NECK TECHNIQUE: Multidetector CT imaging  of the head and neck was performed using the standard protocol during bolus administration of intravenous contrast. Multiplanar CT image reconstructions and MIPs were obtained to evaluate the vascular anatomy. Carotid stenosis measurements (when applicable) are obtained utilizing NASCET criteria, using the distal internal carotid diameter as the denominator. RADIATION DOSE REDUCTION: This exam was performed according to the departmental dose-optimization program which includes automated exposure control, adjustment of the mA and/or kV according to patient size and/or use of iterative reconstruction technique. CONTRAST:  75mL OMNIPAQUE  IOHEXOL  350 MG/ML SOLN COMPARISON:  CT head, Cervical spine CT reported separately. FINDINGS: CTA NECK Skeleton: Reported separately today. Upper chest: Negative. Other neck: Nonvascular neck soft tissue spaces appear within normal limits. Right anterior scalp soft tissue injury in association with nondepressed skull fracture, see comparison. Aortic arch: 4 vessel arch, left vertebral artery arises directly from the arch. No atherosclerosis. Right carotid system: Dense right subclavian contrast venous streak artifact partially obscures the right brachiocephalic artery bifurcation. Otherwise the cervical right carotid system appears patent and negative. Left carotid system: Patent and negative. Vertebral arteries: Proximal right subclavian artery detail mildly obscured. Right vertebral artery patent and negative. Left vertebral artery arises directly from the aortic arch, otherwise normal origin. Late entry of the left vertebral into the cervical transverse foramen (normal variant series 3, image 256). And slightly non dominant appearance of the left vertebral which is patent and negative to the skull base. CTA HEAD Posterior circulation: More codominant appearance of the distal vertebral arteries, mildly tortuous with otherwise normal V4 segments and vertebrobasilar junction. Patent  basilar artery is mildly tortuous. Normal PICA origins. No basilar stenosis. Patent SCA and fetal type bilateral PCA origins. Small P1 segments. Bilateral PCA branches are within normal limits. Anterior circulation: Both ICA siphons are patent and normal. Normal posterior communicating artery origins. Bilateral carotid termini, MCA and ACA origins are patent and normal. Normal anterior communicating artery, bilateral ACA branches. MCA M1 segments and MCA trifurcations are patent without stenosis. Bilateral MCA branches are within normal limits. Venous sinuses: Patent. Anatomic variants: Left vertebral artery arises directly from the aortic arch and is mildly non dominant. Fetal type PCA origins. Review of the MIP images confirms the above findings Review of the MIP images confirms the above findings IMPRESSION: 1. Negative CTA Head and Neck. No traumatic vascular injury identified. 2. CT Head, Face and, Cervical spine arereported separately. Electronically Signed   By: VEAR Hurst M.D.   On: 11/23/2023 08:44   CT CERVICAL SPINE WO CONTRAST Result Date: 11/23/2023 CLINICAL DATA:  23 year old male status post unwitnessed fall. Positive loss of consciousness. EXAM: CT CERVICAL SPINE WITHOUT CONTRAST TECHNIQUE: Multidetector CT imaging of the cervical spine was performed without intravenous contrast. Multiplanar CT image reconstructions were also generated. RADIATION DOSE REDUCTION: This exam was performed according to the departmental dose-optimization program which includes automated exposure control, adjustment of the mA and/or kV according to patient size and/or use of iterative reconstruction technique. COMPARISON:  CT head and face today reported separately. FINDINGS: Alignment: Normal cervical lordosis. Cervicothoracic junction alignment is within normal limits. Bilateral posterior element alignment is within normal limits. Skull base and vertebrae: Bone mineralization is within normal limits. Visualized skull  base is intact. No atlanto-occipital dissociation. C1 and C2 appear intact and aligned. No osseous abnormality identified in the cervical spine. Soft tissues and spinal canal: No prevertebral fluid or swelling. No  visible canal hematoma. Negative visible noncontrast neck soft tissues. Disc levels:  Negative. Upper chest: Negative visible noncontrast thoracic inlet. IMPRESSION: Negative CT appearance of the Cervical spine. Electronically Signed   By: VEAR Hurst M.D.   On: 11/23/2023 08:33   CT MAXILLOFACIAL WO CONTRAST Result Date: 11/23/2023 CLINICAL DATA:  23 year old male status post unwitnessed fall. Positive loss of consciousness. EXAM: CT MAXILLOFACIAL WITHOUT CONTRAST TECHNIQUE: Multidetector CT imaging of the maxillofacial structures was performed. Multiplanar CT image reconstructions were also generated. RADIATION DOSE REDUCTION: This exam was performed according to the departmental dose-optimization program which includes automated exposure control, adjustment of the mA and/or kV according to patient size and/or use of iterative reconstruction technique. COMPARISON:  Head CT today.  Cervical spine CT reported separately. FINDINGS: Osseous: Mandible intact and normally located. Bilateral maxilla, zygoma, pterygoid, and nasal bones are intact. Visible central skull base intact. Linear through and through fracture of the right frontoethmoidal recess and frontal sinus on series 3, image 79, further detailed on head CT today. Orbits: Right orbital roof and orbital walls relatively spared despite the medial right frontal sinus fracture. Globes and intraorbital soft tissues appear intact and normal. Sinuses: Hemorrhage in the right frontal sinus and anterior right ethmoids. Otherwise well aerated bilaterally. Tympanic cavities and mastoids are clear. Soft tissues: Negative visible noncontrast larynx, pharynx, parapharyngeal spaces, retropharyngeal space, sublingual space, submandibular spaces, masticator and  parotid spaces. Limited intracranial: Stable to that reported separately. IMPRESSION: 1. Redemonstration of Linear fracture through and through the right frontoethmoidal recess and frontal sinus. Right orbital walls relatively spared. See Head CT reported separately. 2. No other acute fracture or traumatic injury identified in the Face. Electronically Signed   By: VEAR Hurst M.D.   On: 11/23/2023 08:30   DG Pelvis Portable Result Date: 11/23/2023 CLINICAL DATA:  Unwitnessed fall. Patient may have been as high as 12 feet. EXAM: PORTABLE PELVIS 1-2 VIEWS COMPARISON:  None Available. FINDINGS: There is no evidence of pelvic fracture or diastasis. No pelvic bone lesions are seen. IMPRESSION: Negative. Electronically Signed   By: Camellia Candle M.D.   On: 11/23/2023 07:46   DG Chest Port 1 View Result Date: 11/23/2023 CLINICAL DATA:  Unwitnessed fall from potentially as high as 12 feet. EXAM: PORTABLE CHEST 1 VIEW COMPARISON:  02/26/2019 FINDINGS: The lungs are clear without focal pneumonia, edema, pneumothorax or pleural effusion. The cardiopericardial silhouette is within normal limits for size. No discernible displaced acute rib fracture. Multiple tiny radiodense foreign bodies project over the left lung base, potentially external to the patient. IMPRESSION: 1. No acute cardiopulmonary findings. 2. Multiple tiny radiodense foreign bodies project over the left lung base, potentially external to the patient. Electronically Signed   By: Camellia Candle M.D.   On: 11/23/2023 07:46    Labs: Basic Metabolic Panel: Recent Labs  Lab 11/23/23 0725 11/23/23 0747 11/24/23 0554  NA 137 139 136  K 3.8 3.7 4.0  CL 106 103 104  CO2 21*  --  23  GLUCOSE 118* 117* 96  BUN 8 8 <5*  CREATININE 0.90 1.00 1.05  CALCIUM  9.0  --  9.4   Liver Function Tests: Recent Labs  Lab 11/23/23 0725  AST 32  ALT 38  ALKPHOS 48  BILITOT 0.8  PROT 6.5  ALBUMIN 3.9    CBC: Recent Labs  Lab 11/23/23 0725 11/23/23 0747  11/24/23 0554  WBC 13.2*  --  11.0*  HGB 15.7 16.0 16.5  HCT 46.6 47.0 48.8  MCV 91.2  --  90.0  PLT 232  --  252    CBG: No results for input(s): GLUCAP in the last 168 hours.  Principal Problem:   Abdominal hematoma   Time coordinating discharge: 15 min

## 2023-11-24 NOTE — Plan of Care (Signed)

## 2023-11-24 NOTE — Progress Notes (Signed)
 Discharge paperwork provided to patient. Discussed paperwork with patient and patient's family. All questions and concerns were addressed and answered. IV removed. Patient's family to transport home via private vehicle.

## 2023-11-24 NOTE — Progress Notes (Signed)
 Pt remains Aox4 overnight, continues to complain of severe neck pain. Ice pack frequently on back of neck helps as well as pain medication. Pt denies HA, numbness/tingling/ change in vision. Wound to R forehead cleansed with sterile saline wipes of large amount dried blood on forehead and hairline. No pain, tenderness or discoloration to R abdominal area. Pt tolerating clears diet.

## 2023-11-24 NOTE — Progress Notes (Signed)
 Subjective: CC: Reports diffuse neck pain. No n/t/w of the extremities. No loss of bowel/bladder function. Has been oob. Tolerating cld without abdominal pain, n/v. No BM. Voiding.   Occasional alcohol use. Uses tobacco. No drug use. Lives at home with his wife.   Objective: Vital signs in last 24 hours: Temp:  [98 F (36.7 C)-99 F (37.2 C)] 98.3 F (36.8 C) (08/24 0815) Pulse Rate:  [44-62] 62 (08/24 0815) Resp:  [15-19] 17 (08/24 0815) BP: (121-150)/(79-97) 132/92 (08/24 0815) SpO2:  [100 %] 100 % (08/24 0815) Weight:  [73.5 kg] 73.5 kg (08/24 0556)    Intake/Output from previous day: 08/23 0701 - 08/24 0700 In: 360 [P.O.:360] Out: 400 [Urine:400] Intake/Output this shift: Total I/O In: 120 [P.O.:120] Out: -   PE: Gen:  Alert, NAD, pleasant HEENT: R forehead lac with sutures in place, cdi. EOM's intact, pupils equal and round Neck: He notes ttp at the top of his c-spine. No step offs.  Card:  RRR Pulm:  CTAB, no W/R/R, effort normal Abd: Soft, ND, NT Ext:  No LE edema or calf tenderness. Spont MAE's Neuro: CN 3-12 grossly intact. MAE's. Strength to BUE able and equal. SILT to BUE and BLE Psych: A&Ox3   Lab Results:  Recent Labs    11/23/23 0725 11/23/23 0747 11/24/23 0554  WBC 13.2*  --  11.0*  HGB 15.7 16.0 16.5  HCT 46.6 47.0 48.8  PLT 232  --  252   BMET Recent Labs    11/23/23 0725 11/23/23 0747 11/24/23 0554  NA 137 139 136  K 3.8 3.7 4.0  CL 106 103 104  CO2 21*  --  23  GLUCOSE 118* 117* 96  BUN 8 8 <5*  CREATININE 0.90 1.00 1.05  CALCIUM  9.0  --  9.4   PT/INR Recent Labs    11/23/23 0725  LABPROT 13.4  INR 1.0   CMP     Component Value Date/Time   NA 136 11/24/2023 0554   K 4.0 11/24/2023 0554   CL 104 11/24/2023 0554   CO2 23 11/24/2023 0554   GLUCOSE 96 11/24/2023 0554   BUN <5 (L) 11/24/2023 0554   CREATININE 1.05 11/24/2023 0554   CALCIUM  9.4 11/24/2023 0554   PROT 6.5 11/23/2023 0725   ALBUMIN 3.9 11/23/2023  0725   AST 32 11/23/2023 0725   ALT 38 11/23/2023 0725   ALKPHOS 48 11/23/2023 0725   BILITOT 0.8 11/23/2023 0725   GFRNONAA >60 11/24/2023 0554   GFRAA >60 02/15/2019 1435   Lipase     Component Value Date/Time   LIPASE 19 02/15/2019 1435    Studies/Results: DG Cervical Spine With Flex & Extend Result Date: 11/23/2023 CLINICAL DATA:  Neck pain. EXAM: CERVICAL SPINE COMPLETE WITH FLEXION AND EXTENSION VIEWS COMPARISON:  None Available. FINDINGS: No acute fracture or subluxation. No listhesis on flexion or extension. The vertebral body heights and disc spaces are maintained. The visualized posterior elements and odontoid are intact. There is anatomic alignment of the lateral masses of C1 and C2. The soft tissues are unremarkable. IMPRESSION: Negative cervical spine radiographs. Electronically Signed   By: Vanetta Chou M.D.   On: 11/23/2023 11:51   CT CHEST ABDOMEN PELVIS W CONTRAST Result Date: 11/23/2023 CLINICAL DATA:  Unwitnessed fall. Patient may have been as high as 12 feet. Blunt poly trauma. EXAM: CT CHEST, ABDOMEN, AND PELVIS WITH CONTRAST TECHNIQUE: Multidetector CT imaging of the chest, abdomen and pelvis was performed following the  standard protocol during bolus administration of intravenous contrast. RADIATION DOSE REDUCTION: This exam was performed according to the departmental dose-optimization program which includes automated exposure control, adjustment of the mA and/or kV according to patient size and/or use of iterative reconstruction technique. CONTRAST:  75mL OMNIPAQUE  IOHEXOL  350 MG/ML SOLN COMPARISON:  None Available. FINDINGS: CT CHEST FINDINGS Cardiovascular: The heart size is normal. No substantial pericardial effusion. Mediastinum/Nodes: Subtle soft tissue in the high anterior mediastinum with triangular configuration is consistent with thymic remnant. No evidence for mediastinal edema or hemorrhage. No mediastinal lymphadenopathy. There is no hilar lymphadenopathy.  The esophagus has normal imaging features. There is no axillary lymphadenopathy. Lungs/Pleura: No evidence for pneumothorax or pleural effusion. Trace paraseptal emphysema noted right lung apex. No lung contusion. Dependent atelectasis noted in the lung bases, right greater than left. Musculoskeletal: No worrisome lytic or sclerotic osseous abnormality. No evidence for rib fracture. No clavicle or scapular fracture. No sternal fracture. No evidence for thoracic spine fracture CT ABDOMEN PELVIS FINDINGS Hepatobiliary: No evidence for liver laceration or contusion. Intrahepatic biliary duct prominence with mild periportal edema. There is no evidence for gallstones, gallbladder wall thickening, or pericholecystic fluid. No intrahepatic or extrahepatic biliary dilation. Pancreas: No focal mass lesion. No dilatation of the main duct. No intraparenchymal cyst. No peripancreatic edema. Spleen: No splenomegaly. No suspicious focal mass lesion. Adrenals/Urinary Tract: No adrenal nodule or mass. Kidneys unremarkable. No evidence for hydroureter. The urinary bladder appears normal for the degree of distention. Stomach/Bowel: Stomach is unremarkable. No gastric wall thickening. No evidence of outlet obstruction. Duodenum is normally positioned as is the ligament of Treitz. No small bowel wall thickening. No small bowel dilatation. The terminal ileum is normal. The appendix is not well visualized, but there is no edema or inflammation in the region of the cecal tip to suggest appendicitis. No gross colonic mass. No colonic wall thickening. No gross colonic mass. No colonic wall thickening. Vascular/Lymphatic: No abdominal aortic aneurysm. No abdominal aortic atherosclerotic calcification. There is no gastrohepatic or hepatoduodenal ligament lymphadenopathy. No retroperitoneal or mesenteric lymphadenopathy. No pelvic sidewall lymphadenopathy. Reproductive: The prostate gland and seminal vesicles are unremarkable. Other: There is  edema/hemorrhage in the region of the hepatoduodenal ligament in upper abdominal retroperitoneal space (see axial images 64 and 66 of series 6). 3.6 x 2.2 cm heterogeneous soft tissue density medial to the gallbladder on 69/6 could possibly be a lymph node although a contained area of hemorrhage could also have this appearance. (See coronal 59/9). Subtle edema is seen a in the aortocaval space on 72/6. No contrast extravasation in these regions to suggest active or ongoing bleeding at the time of imaging. There is a small amount of free fluid in the pelvis (image 115/6) with attenuation higher than would be expected for simple fluid raising the question of blood products. Musculoskeletal: No evidence for an acute fracture in the bony anatomy of the pelvis. SI joints and symphysis pubis unremarkable. No evidence for lumbar spine or sacral fracture. IMPRESSION: 1. Edema/hemorrhage in the region of the hepatoduodenal ligament and upper abdominal retroperitoneal space. 3.6 x 2.2 cm heterogeneous soft tissue density medial to the gallbladder suggests a contained area of hemorrhage. Enlarged lymph node a consideration but considered less likely. No contrast extravasation in these regions to suggest active or ongoing bleeding at the time of imaging. 2. Small amount of free fluid in the pelvis with attenuation higher than would be expected for simple fluid raising the question of blood products. 3. Intrahepatic biliary duct prominence  with mild periportal edema. No evidence for liver laceration or contusion. No discernible perihepatic fluid. No evidence for splenic injury. 4. No evidence for an acute fracture in the bony anatomy of the chest, abdomen, or pelvis. 5. No evidence for pneumothorax or pleural effusion. No lung contusion. 6.  Emphysema (ICD10-J43.9). I discussed these findings by telephone with provider Dorn Dec at approximately 0845 hours on 11/23/2023. Electronically Signed   By: Camellia Candle M.D.    On: 11/23/2023 08:46   CT HEAD WO CONTRAST Addendum Date: 11/23/2023 ADDENDUM REPORT: 11/23/2023 08:45 ADDENDUM: Study discussed by telephone with provider JONATHAN GILLIAM on 11/23/2023 at 0830 hours. Electronically Signed   By: VEAR Hurst M.D.   On: 11/23/2023 08:45   Result Date: 11/23/2023 CLINICAL DATA:  23 year old male status post unwitnessed fall. Positive loss of consciousness. EXAM: CT HEAD WITHOUT CONTRAST TECHNIQUE: Contiguous axial images were obtained from the base of the skull through the vertex without intravenous contrast. RADIATION DOSE REDUCTION: This exam was performed according to the departmental dose-optimization program which includes automated exposure control, adjustment of the mA and/or kV according to patient size and/or use of iterative reconstruction technique. COMPARISON:  Brain MRI 03/22/2020. CT face and cervical spine today reported separately. FINDINGS: Brain: Trace pneumocephalus deep to the right frontal sinus. Trace right anterior inferior frontal gyrus linear subarachnoid hemorrhage. Series 3, image 17 and series 5, image 63). No definite hemorrhagic contusion. Trace extra-axial blood along the anterior falx which could be in the subarachnoid or subdural space. And there is evidence of a 2 mm right anterior convexity subdural hematoma also on series 3, image 21. No other acute intracranial hemorrhage. Normal underlying cerebral volume. No midline shift, ventriculomegaly, mass effect, evidence of mass lesion, intracranial hemorrhage or evidence of cortically based acute infarction. Normal basilar cisterns. Normal gray-white differentiation outside of the right anterior inferior frontal gyrus. Vascular: No suspicious intracranial vascular hyperdensity. Skull: Linear fracture through the right frontal bone including through and through the right frontal sinus, frontoethmoidal recess (series 4, image 31). Fracture tracks cephalad toward the vertex. Underlying normal bone  mineralization. No other No acute osseous abnormality identified. Sinuses/Orbits: Hemorrhage in the right frontal sinus, frontoethmoidal recess. Other paranasal sinuses are stable and well aerated. Tympanic cavities and mastoids appear clear. Other: Right forehead soft tissue injury including laceration with trace soft tissue gas. Underlying nondepressed right frontal bone fracture. Orbits soft tissues appear negative. Traumatic Brain Injury Risk Stratification Skull Fracture: Nondisplaced - Moderate/mBIG 2 Subdural Hematoma (SDH): <36mm - mBIG 1 (Right anterior frontal convexity, para falcine) Subarachnoid Hemorrhage [facility]): trace (up to 2 sulci) - Low/mBIG 1 Epidural Hematoma (EDH): No - Low/mBIG 1 Cerebral contusion, intra-axial, intraparenchymal Hemorrhage (IPH): No Intraventricular Hemorrhage (IVH): No - Low/mBIG 1 Midline Shift > 1mm or Edema/effacement of sulci/vents: No - Low/mBIG 1 ---------------------------------------------------- IMPRESSION: 1. Positive for nondepressed anterior right frontal bone fracture which is through and through the right frontal sinus and frontoethmoidal recess. Trace pneumocephalus. Small volume right anterior convexity and para falcine subdural hematoma (less than 4 mm) And trace right anterior frontal lobe Subarachnoid Hemorrhage. 2. No intracranial mass effect or midline shift. 3. CT face and cervical spine are reported separately. Electronically Signed: By: VEAR Hurst M.D. On: 11/23/2023 08:26   CT ANGIO NECK W OR WO CONTRAST Result Date: 11/23/2023 CLINICAL DATA:  23 year old male status post unwitnessed fall. Positive loss of consciousness. EXAM: CT ANGIOGRAPHY HEAD AND NECK TECHNIQUE: Multidetector CT imaging of the head and neck was performed using the  standard protocol during bolus administration of intravenous contrast. Multiplanar CT image reconstructions and MIPs were obtained to evaluate the vascular anatomy. Carotid stenosis measurements (when applicable) are  obtained utilizing NASCET criteria, using the distal internal carotid diameter as the denominator. RADIATION DOSE REDUCTION: This exam was performed according to the departmental dose-optimization program which includes automated exposure control, adjustment of the mA and/or kV according to patient size and/or use of iterative reconstruction technique. CONTRAST:  75mL OMNIPAQUE  IOHEXOL  350 MG/ML SOLN COMPARISON:  CT head, Cervical spine CT reported separately. FINDINGS: CTA NECK Skeleton: Reported separately today. Upper chest: Negative. Other neck: Nonvascular neck soft tissue spaces appear within normal limits. Right anterior scalp soft tissue injury in association with nondepressed skull fracture, see comparison. Aortic arch: 4 vessel arch, left vertebral artery arises directly from the arch. No atherosclerosis. Right carotid system: Dense right subclavian contrast venous streak artifact partially obscures the right brachiocephalic artery bifurcation. Otherwise the cervical right carotid system appears patent and negative. Left carotid system: Patent and negative. Vertebral arteries: Proximal right subclavian artery detail mildly obscured. Right vertebral artery patent and negative. Left vertebral artery arises directly from the aortic arch, otherwise normal origin. Late entry of the left vertebral into the cervical transverse foramen (normal variant series 3, image 256). And slightly non dominant appearance of the left vertebral which is patent and negative to the skull base. CTA HEAD Posterior circulation: More codominant appearance of the distal vertebral arteries, mildly tortuous with otherwise normal V4 segments and vertebrobasilar junction. Patent basilar artery is mildly tortuous. Normal PICA origins. No basilar stenosis. Patent SCA and fetal type bilateral PCA origins. Small P1 segments. Bilateral PCA branches are within normal limits. Anterior circulation: Both ICA siphons are patent and normal. Normal  posterior communicating artery origins. Bilateral carotid termini, MCA and ACA origins are patent and normal. Normal anterior communicating artery, bilateral ACA branches. MCA M1 segments and MCA trifurcations are patent without stenosis. Bilateral MCA branches are within normal limits. Venous sinuses: Patent. Anatomic variants: Left vertebral artery arises directly from the aortic arch and is mildly non dominant. Fetal type PCA origins. Review of the MIP images confirms the above findings Review of the MIP images confirms the above findings IMPRESSION: 1. Negative CTA Head and Neck. No traumatic vascular injury identified. 2. CT Head, Face and, Cervical spine arereported separately. Electronically Signed   By: VEAR Hurst M.D.   On: 11/23/2023 08:44   CT CERVICAL SPINE WO CONTRAST Result Date: 11/23/2023 CLINICAL DATA:  23 year old male status post unwitnessed fall. Positive loss of consciousness. EXAM: CT CERVICAL SPINE WITHOUT CONTRAST TECHNIQUE: Multidetector CT imaging of the cervical spine was performed without intravenous contrast. Multiplanar CT image reconstructions were also generated. RADIATION DOSE REDUCTION: This exam was performed according to the departmental dose-optimization program which includes automated exposure control, adjustment of the mA and/or kV according to patient size and/or use of iterative reconstruction technique. COMPARISON:  CT head and face today reported separately. FINDINGS: Alignment: Normal cervical lordosis. Cervicothoracic junction alignment is within normal limits. Bilateral posterior element alignment is within normal limits. Skull base and vertebrae: Bone mineralization is within normal limits. Visualized skull base is intact. No atlanto-occipital dissociation. C1 and C2 appear intact and aligned. No osseous abnormality identified in the cervical spine. Soft tissues and spinal canal: No prevertebral fluid or swelling. No visible canal hematoma. Negative visible noncontrast  neck soft tissues. Disc levels:  Negative. Upper chest: Negative visible noncontrast thoracic inlet. IMPRESSION: Negative CT appearance of the Cervical spine.  Electronically Signed   By: VEAR Hurst M.D.   On: 11/23/2023 08:33   CT MAXILLOFACIAL WO CONTRAST Result Date: 11/23/2023 CLINICAL DATA:  23 year old male status post unwitnessed fall. Positive loss of consciousness. EXAM: CT MAXILLOFACIAL WITHOUT CONTRAST TECHNIQUE: Multidetector CT imaging of the maxillofacial structures was performed. Multiplanar CT image reconstructions were also generated. RADIATION DOSE REDUCTION: This exam was performed according to the departmental dose-optimization program which includes automated exposure control, adjustment of the mA and/or kV according to patient size and/or use of iterative reconstruction technique. COMPARISON:  Head CT today.  Cervical spine CT reported separately. FINDINGS: Osseous: Mandible intact and normally located. Bilateral maxilla, zygoma, pterygoid, and nasal bones are intact. Visible central skull base intact. Linear through and through fracture of the right frontoethmoidal recess and frontal sinus on series 3, image 79, further detailed on head CT today. Orbits: Right orbital roof and orbital walls relatively spared despite the medial right frontal sinus fracture. Globes and intraorbital soft tissues appear intact and normal. Sinuses: Hemorrhage in the right frontal sinus and anterior right ethmoids. Otherwise well aerated bilaterally. Tympanic cavities and mastoids are clear. Soft tissues: Negative visible noncontrast larynx, pharynx, parapharyngeal spaces, retropharyngeal space, sublingual space, submandibular spaces, masticator and parotid spaces. Limited intracranial: Stable to that reported separately. IMPRESSION: 1. Redemonstration of Linear fracture through and through the right frontoethmoidal recess and frontal sinus. Right orbital walls relatively spared. See Head CT reported separately. 2.  No other acute fracture or traumatic injury identified in the Face. Electronically Signed   By: VEAR Hurst M.D.   On: 11/23/2023 08:30   DG Pelvis Portable Result Date: 11/23/2023 CLINICAL DATA:  Unwitnessed fall. Patient may have been as high as 12 feet. EXAM: PORTABLE PELVIS 1-2 VIEWS COMPARISON:  None Available. FINDINGS: There is no evidence of pelvic fracture or diastasis. No pelvic bone lesions are seen. IMPRESSION: Negative. Electronically Signed   By: Camellia Candle M.D.   On: 11/23/2023 07:46   DG Chest Port 1 View Result Date: 11/23/2023 CLINICAL DATA:  Unwitnessed fall from potentially as high as 12 feet. EXAM: PORTABLE CHEST 1 VIEW COMPARISON:  02/26/2019 FINDINGS: The lungs are clear without focal pneumonia, edema, pneumothorax or pleural effusion. The cardiopericardial silhouette is within normal limits for size. No discernible displaced acute rib fracture. Multiple tiny radiodense foreign bodies project over the left lung base, potentially external to the patient. IMPRESSION: 1. No acute cardiopulmonary findings. 2. Multiple tiny radiodense foreign bodies project over the left lung base, potentially external to the patient. Electronically Signed   By: Camellia Candle M.D.   On: 11/23/2023 07:46    Anti-infectives: Anti-infectives (From admission, onward)    Start     Dose/Rate Route Frequency Ordered Stop   11/23/23 0900  ceFAZolin  (ANCEF ) IVPB 2g/100 mL premix        2 g 200 mL/hr over 30 Minutes Intravenous  Once 11/23/23 0856 11/23/23 0943        Assessment/Plan 23 yo male hit in head/fall 12 ft R front bone fx with pneumocephalus, SDH, SAH - Per NSGY. Dr. Mavis. No follow up imaging recommended. TBI therapies. Pain control, follow up with Mavis in 2 weeks Neck Pain - Per NSGY. Dr. Mavis. CT C-spine neg. CTA neck neg. Flex/Ext neg.  Hepatoduodenal ligament hematoma - Hgb stable. NT on exam. HDS. Adv diet and monitor.  R forehead laceration - s/p repair 8/23 by EDP. Will  need follow up in the office for removal.  FEN- Reg VTE- SCDs,  hold for TBI and hematoma ID- none currently Foley - None, spont void.  Dispo- Adv diet. Therapies. Possible d/c in AM.   I reviewed nursing notes, Consultant (NSGY) notes, last 24 h vitals and pain scores, last 48 h intake and output, last 24 h labs and trends, and last 24 h imaging results.    LOS: 0 days    Ozell CHRISTELLA Shaper, [facility] 11/24/2023, 9:55 AM Please see Amion for pager number during day hours 7:00am-4:30pm

## 2023-12-20 ENCOUNTER — Encounter: Payer: Self-pay | Admitting: General Practice

## 2023-12-20 ENCOUNTER — Ambulatory Visit (HOSPITAL_BASED_OUTPATIENT_CLINIC_OR_DEPARTMENT_OTHER): Admitting: Family Medicine

## 2023-12-20 ENCOUNTER — Ambulatory Visit: Admitting: General Practice

## 2023-12-20 VITALS — BP 120/66 | HR 62 | Temp 98.3°F | Ht 70.5 in | Wt 153.8 lb

## 2023-12-20 DIAGNOSIS — Z09 Encounter for follow-up examination after completed treatment for conditions other than malignant neoplasm: Secondary | ICD-10-CM | POA: Insufficient documentation

## 2023-12-20 DIAGNOSIS — S301XXA Contusion of abdominal wall, initial encounter: Secondary | ICD-10-CM

## 2023-12-20 DIAGNOSIS — R519 Headache, unspecified: Secondary | ICD-10-CM | POA: Diagnosis not present

## 2023-12-20 DIAGNOSIS — W19XXXA Unspecified fall, initial encounter: Secondary | ICD-10-CM | POA: Insufficient documentation

## 2023-12-20 DIAGNOSIS — Z7689 Persons encountering health services in other specified circumstances: Secondary | ICD-10-CM | POA: Insufficient documentation

## 2023-12-20 DIAGNOSIS — W19XXXD Unspecified fall, subsequent encounter: Secondary | ICD-10-CM

## 2023-12-20 MED ORDER — IBUPROFEN 800 MG PO TABS: 800.0000 mg | ORAL_TABLET | Freq: Three times a day (TID) | ORAL | 0 refills | Status: DC | PRN

## 2023-12-20 MED ORDER — METHOCARBAMOL 500 MG PO TABS: 500.0000 mg | ORAL_TABLET | Freq: Three times a day (TID) | ORAL | 0 refills | Status: AC | PRN

## 2023-12-20 NOTE — Assessment & Plan Note (Signed)
 Scheduled for a follow up CT through workers comp

## 2023-12-20 NOTE — Progress Notes (Signed)
 New Patient Office Visit  Subjective    Patient ID: KEARY WATERSON, male    DOB: 10/10/2000  Age: 23 y.o. MRN: [id number]  CC:  Chief Complaint  Patient presents with   New Patient (Initial Visit)    HPI HOSAM MCFETRIDGE is a 23 y.o. male presents to establish care.  Previous PCP/physical/labs:   Discussed the use of AI scribe software for clinical note transcription with the patient, who gave verbal consent to proceed.  History of Present Illness ZAYQUAN BOGARD is a 23 year old male who presents with headaches and neck pain following a work-related head injury.  On 11/23/23, he experienced a work-related accident where an object shot out from a machine and struck him on the head, causing him to fall onto the deck he was standing on. This incident resulted in a brief loss of consciousness. He was taken to the emergency room where he was diagnosed with a hematoma on the brain and another near the liver. He was treated with muscle relaxers and ibuprofen  and discharged the following day.  Since the incident, he has been experiencing daily headaches, particularly when trying to focus or concentrate. The muscle relaxers have been somewhat effective in alleviating his headaches and neck pain. He has not experienced any seizures, loss of consciousness, or slurred speech since the initial event. No fever, chills, chest pain, shortness of breath, or urinary and stomach symptoms.  He has been attending physical therapy twice a week and has been seen at [facility] urgent care, which has continued his medication regimen initiated at the hospital. He is currently taking ibuprofen  and muscle relaxers, but no other medications.  He has an upcoming MRI of his neck and a CT scan of his stomach scheduled for December 24, 2023, as part of his ongoing evaluation. He has not yet been able to see a neurologist due to issues with worker's compensation coverage, although he attempted to attend an appointment  which was not honored due to insurance issues.   Outpatient Encounter Medications as of 12/20/2023  Medication Sig   lidocaine  (LIDODERM ) 5 % Place 1 patch onto the skin daily. Remove & Discard patch within 12 hours or as directed by MD   [DISCONTINUED] ibuprofen  (ADVIL ) 800 MG tablet Take 1 tablet (800 mg total) by mouth every 8 (eight) hours as needed.   [DISCONTINUED] methocarbamol  (ROBAXIN ) 500 MG tablet Take 1 tablet (500 mg total) by mouth every 8 (eight) hours as needed for up to 30 doses for muscle spasms.   ibuprofen  (ADVIL ) 800 MG tablet Take 1 tablet (800 mg total) by mouth every 8 (eight) hours as needed.   methocarbamol  (ROBAXIN ) 500 MG tablet Take 1 tablet (500 mg total) by mouth every 8 (eight) hours as needed for up to 30 doses for muscle spasms.   [DISCONTINUED] famotidine  (PEPCID ) 20 MG tablet Take 1 tablet (20 mg total) by mouth 2 (two) times daily.   [DISCONTINUED] oxyCODONE  (OXY IR/ROXICODONE ) 5 MG immediate release tablet Take 1 tablet (5 mg total) by mouth every 6 (six) hours as needed for severe pain (pain score 7-10).   No facility-administered encounter medications on file as of 12/20/2023.    Past Medical History:  Diagnosis Date   ADHD (attention deficit hyperactivity disorder)    no current med.   ADHD (attention deficit hyperactivity disorder) 11/28/2010   Adjustment disorder with depressed mood 09/07/2015   Anxiety    Benign skin cyst 12/2016   left cheek (face)  Epidermal cyst of face 12/17/2016   Foster care child 06/27/2017   Heart murmur     Past Surgical History:  Procedure Laterality Date   CYST EXCISION Left 01/10/2017   Procedure: EXCISION CYST LEFT CHEEK;  Surgeon: Claudius Kaplan, MD;  Location: [facility];  Service: General;  Laterality: Left;    Family History  Problem Relation Age of Onset   Anxiety disorder Mother    Diabetes Maternal Grandmother    Hypertension Maternal Grandmother    Parkinson's disease Paternal  Grandfather    Anxiety disorder Father     Social History   Socioeconomic History   Marital status: Single    Spouse name: Not on file   Number of children: 0   Years of education: Not on file   Highest education level: Not on file  Occupational History   Occupation: student    Comment: Barista   Tobacco Use   Smoking status: Every Day    Current packs/day: 0.25    Types: Cigarettes   Smokeless tobacco: Never  Vaping Use   Vaping status: Former  Substance and Sexual Activity   Alcohol use: No    Alcohol/week: 0.0 standard drinks of alcohol   Drug use: Not Currently    Types: Marijuana   Sexual activity: Not on file  Other Topics Concern   Not on file  Social History Narrative   Lives with legal guardian, Greig Gust - to bring documentation of guardianship DOS.   Social Drivers of Corporate investment banker Strain: Not on file  Food Insecurity: No Food Insecurity (11/23/2023)   Hunger Vital Sign    Worried About Running Out of Food in the Last Year: Never true    Ran Out of Food in the Last Year: Never true  Transportation Needs: No Transportation Needs (11/23/2023)   PRAPARE - Administrator, Civil Service (Medical): No    Lack of Transportation (Non-Medical): No  Physical Activity: Not on file  Stress: Not on file  Social Connections: Not on file  Intimate Partner Violence: Not At Risk (11/23/2023)   Humiliation, Afraid, Rape, and Kick questionnaire    Fear of Current or Ex-Partner: No    Emotionally Abused: No    Physically Abused: No    Sexually Abused: No    Review of Systems  Constitutional:  Negative for chills and fever.  Respiratory:  Negative for shortness of breath.   Cardiovascular:  Negative for chest pain.  Gastrointestinal:  Negative for abdominal pain, constipation, diarrhea, heartburn, nausea and vomiting.  Genitourinary:  Negative for dysuria, frequency and urgency.  Neurological:  Positive for headaches. Negative for  dizziness, tremors, speech change, seizures and loss of consciousness.  Endo/Heme/Allergies:  Negative for polydipsia.  Psychiatric/Behavioral:  Negative for depression and suicidal ideas. The patient is not nervous/anxious.         Objective    BP 120/66   Pulse 62   Temp 98.3 F (36.8 C) (Oral)   Ht 5' 10.5 (1.791 m)   Wt 153 lb 12.8 oz (69.8 kg)   SpO2 98%   BMI 21.76 kg/m   Physical Exam Vitals and nursing note reviewed.  Constitutional:      Appearance: Normal appearance.  Cardiovascular:     Rate and Rhythm: Normal rate and regular rhythm.     Pulses: Normal pulses.     Heart sounds: Normal heart sounds.  Pulmonary:     Effort: Pulmonary effort is normal.  Breath sounds: Normal breath sounds.  Neurological:     Mental Status: He is alert and oriented to person, place, and time.  Psychiatric:        Mood and Affect: Mood normal.        Behavior: Behavior normal.        Thought Content: Thought content normal.        Judgment: Judgment normal.         Assessment & Plan:  Hospital discharge follow-up Assessment & Plan: Reviewed hospitalization notes, labs and imaging.    Establishing care with new doctor, encounter for Assessment & Plan: EMR reviewed briefly.    Acute nonintractable headache, unspecified headache type Assessment & Plan: No red flags on exam.  Due to frequent headaches and given recent fall, recommend follow up with neurosurgery ASAP.  They will contact their worker's comp approved neurosurgeon.   Work note provided.   They will follow up with me after they see neurosurgery.  Strict ER precautions provided. Him and his wife both verbalize understanding. Refill sent for Robaxin  and Ibuprofen .  Orders: -     Ibuprofen ; Take 1 tablet (800 mg total) by mouth every 8 (eight) hours as needed.  Dispense: 30 tablet; Refill: 0 -     Methocarbamol ; Take 1 tablet (500 mg total) by mouth every 8 (eight) hours as needed for up to 30  doses for muscle spasms.  Dispense: 30 tablet; Refill: 0  Fall, subsequent encounter  Abdominal hematoma Assessment & Plan: Scheduled for a follow up CT through workers comp     Assessment and Plan Assessment & Plan Traumatic intracranial hemorrhage with daily headaches - Contact worker's compensation for neurosurgery provider information. - Schedule neurosurgery evaluation for hematoma. - Advised ER visit if headaches worsen or neurological symptoms develop. - Refilled ibuprofen  and muscle relaxers. - work note provided until he is seen by neurology.  - neuro exam stable today; no red flags.  Neck pain following head trauma Neck pain post head trauma, managed with physical therapy and medication. - Continue physical therapy twice weekly. - Refilled ibuprofen  and muscle relaxers.   Return if symptoms worsen or fail to improve.   Carrol Aurora, NP

## 2023-12-20 NOTE — Assessment & Plan Note (Addendum)
 No red flags on exam.  Due to frequent headaches and given recent fall, recommend follow up with neurosurgery ASAP.  They will contact their worker's comp approved neurosurgeon.   Work note provided.   They will follow up with me after they see neurosurgery.  Strict ER precautions provided. Him and his wife both verbalize understanding. Refill sent for Robaxin  and Ibuprofen .

## 2023-12-20 NOTE — Patient Instructions (Addendum)
 Please call workers comp to ask about the neurosurgery and schedule an appt with them ASAP.   Refill sent for the muscle relaxer and Ibuprofen .   As discussed, please go to the ER if you develop any of the symptoms we discussed.   Follow up with me after you have seen neurosurgery.   It was a pleasure to meet you today! Please don't hesitate to contact me with any questions. Welcome to [facility]!

## 2023-12-20 NOTE — Assessment & Plan Note (Signed)
 EMR reviewed briefly.

## 2023-12-20 NOTE — Assessment & Plan Note (Signed)
 Reviewed hospitalization notes, labs and imaging.

## 2023-12-24 ENCOUNTER — Telehealth: Payer: Self-pay

## 2023-12-24 NOTE — Telephone Encounter (Signed)
 Copied from CRM [id number]. Topic: General - Other >> Dec 24, 2023  1:27 PM Aleatha C wrote: Reason for CRM: Patty the Case Nurse from Workers comp and wants the provider to follow up on the scan that he had and would like the post appointment notes to be sent over to them fax # [phone] att Odetta Also Odetta would like a call to know if the Dr is going to follow up on Patient's Imaging appointment and to give her a call so she knows if she should fax over imaging from that appointment [phone]

## 2023-12-24 NOTE — Telephone Encounter (Signed)
 Spoke with patty from workers comp; explained to her we do not see patients for workers comp just see patients as their pcp needs. She requested records from visit with Carrol Aurora, NP and I explained that we need a records release form signed by the patient in order to do so. Odetta is going to work on that and send over once done.

## 2024-01-02 ENCOUNTER — Encounter: Payer: Self-pay | Admitting: Physical Medicine & Rehabilitation

## 2024-02-12 ENCOUNTER — Encounter: Admitting: Physical Medicine & Rehabilitation

## 2024-02-12 NOTE — Patient Instructions (Incomplete)
 This is an initial office eval for Mr. Alexander Cisneros who is a 23 year old male with a history of scoliosis and chronic back pain as well as ADHD who suffered a fall at work on 11/23/2023 with a right forehead laceration with brief loss of consciousness.  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  Patient was brought to [facility] and head CT at the time demonstrated a right frontal skull fracture with a small amount of associated subdural hematoma, subarachnoid hemorrhage, and pneumocephaly.  Neurosurgery was consulted and recommended conservative management.  Patient was discharged home the following day.  Since discharge the patient has had problems with headaches and neck pain.  Patient was prescribed physical therapy which she attended twice a week.  He also was given ibuprofen  and Robaxin  for his head and neck symptoms.  Apparently an MRI of his neck was planned in December.

## 2024-04-22 ENCOUNTER — Encounter: Attending: Physical Medicine & Rehabilitation | Admitting: Physical Medicine & Rehabilitation

## 2024-04-22 ENCOUNTER — Encounter: Payer: Self-pay | Admitting: Physical Medicine & Rehabilitation

## 2024-04-22 VITALS — BP 108/65 | HR 88 | Ht 70.5 in | Wt 163.2 lb

## 2024-04-22 DIAGNOSIS — G479 Sleep disorder, unspecified: Secondary | ICD-10-CM | POA: Diagnosis not present

## 2024-04-22 DIAGNOSIS — R519 Headache, unspecified: Secondary | ICD-10-CM | POA: Insufficient documentation

## 2024-04-22 DIAGNOSIS — S069XAS Unspecified intracranial injury with loss of consciousness status unknown, sequela: Secondary | ICD-10-CM | POA: Diagnosis not present

## 2024-04-22 DIAGNOSIS — F0781 Postconcussional syndrome: Secondary | ICD-10-CM | POA: Insufficient documentation

## 2024-04-22 DIAGNOSIS — M542 Cervicalgia: Secondary | ICD-10-CM | POA: Insufficient documentation

## 2024-04-22 DIAGNOSIS — R4189 Other symptoms and signs involving cognitive functions and awareness: Secondary | ICD-10-CM | POA: Insufficient documentation

## 2024-04-22 MED ORDER — IBUPROFEN 800 MG PO TABS: 800.0000 mg | ORAL_TABLET | Freq: Three times a day (TID) | ORAL | 0 refills | Status: AC | PRN

## 2024-04-22 MED ORDER — TRAZODONE HCL 50 MG PO TABS: 50.0000 mg | ORAL_TABLET | Freq: Every day | ORAL | 2 refills | Status: AC

## 2024-04-22 NOTE — Progress Notes (Signed)
 "  Subjective:    Patient ID: Alexander Cisneros, male    DOB: [birth year], 24 y.o.   MRN: [id number]  HPI  Discussed the use of AI scribe software for clinical note transcription with the patient, who gave verbal consent to proceed.  History of Present Illness Alexander Cisneros is a 24 year old male with traumatic brain injury who presents for evaluation of persistent postconcussion symptoms.  Traumatic Brain Injury and Initial Management: - Sustained a significant head injury on November 23, 2023, at work when a 60-pound metal pin struck the right frontal region - No recollection of the event; first became aware upon waking in a [employer] container at the worksite - Hospitalized for one day; head CT revealed trace pneumocephalus in the right frontal sinus, trace subarachnoid hemorrhage in the right inferior anterior frontal gyrus, subdural hematoma in the interhemispheric falx, and right frontal bone fracture - Managed conservatively per neurosurgery; discharged home on November 24, 2023 - Perihepatic hematoma identified in November 2025, details unclear  Cognitive Dysfunction and Neuropsychiatric Symptoms: - Persistent headaches, poor concentration, and memory deficits since injury - Frequent episodes of losing focus and transient lapses in attention during activities - Difficulty with cognitive tasks, including math and word recall - Increased irritability and agitation, especially around children - Depressed mood and prominent fatigue - No prior treatment for depression or discussion with healthcare provider - Partner observes new difficulties with tasks such as paperwork  Sleep Disturbance: - Severe insomnia, typically sleeping only 2-3 hours at a time - Frequent awakenings and sensation of being awake despite eyes closed - No difficulty initiating sleep, but significant trouble maintaining sleep - Prominent daytime fatigue and feeling drained  Cervicalgia and Musculoskeletal Symptoms: -  Persistent cervical discomfort radiating into both shoulders - Initially unable to move neck and required neck pillow for support - Significant improvement in neck mobility and pain with physical therapy since September 2025 - Physical therapy has improved upper body strength and balance - Ongoing difficulty with activities such as shooting a bow - No current vertigo  Visual Symptoms: - Intermittent episodes of convergence insufficiency, especially when focusing intently or watching TV - No diplopia, blurred vision, or hearing loss  Pain Management: - Previously used methocarbamol  and ibuprofen ; no longer prescribed - Currently uses over-the-counter BC powder and acetaminophen  as needed - Muscle relaxants were helpful for sleep but only prescribed once [employer] and worker's compensation issues have limited access to some medications  Other Neurological Symptoms: - No bowel or bladder dysfunction  Relevant Past Medical History: - History of ADHD and anxiety predating injury; childhood diagnosis and treatment for ADHD - Has not been on medication for ADHD for many years - No prior concussions or similar injuries - Sustained a head laceration requiring staples as a child    Pain Inventory Average Pain 6 Pain Right Now 6 My pain is constant, sharp, and aching  In the last 24 hours, has pain interfered with the following? General activity 4 Relation with others 7 Enjoyment of life 7 What TIME of day is your pain at its worst? morning , daytime, evening, and night Sleep (in general) Poor  Pain is worse with: bending, sitting, standing, and some activites Pain improves with: rest and medication Relief from Meds: 1  walk without assistance how many minutes can you walk? hour ability to climb steps?  yes do you drive?  no  not employed: date last employed Nov 23, 2023  confusion depression anxiety  Any changes since last visit?  yes  Primary care Carrol Aurora,  NP    Family History  Problem Relation Age of Onset   Anxiety disorder Mother    Diabetes Maternal Grandmother    Hypertension Maternal Grandmother    Parkinson's disease Paternal Grandfather    Anxiety disorder Father    Social History   Socioeconomic History   Marital status: Single    Spouse name: Not on file   Number of children: 0   Years of education: Not on file   Highest education level: Not on file  Occupational History   Occupation: student    Comment: Barista   Tobacco Use   Smoking status: Every Day    Current packs/day: 0.25    Types: Cigarettes   Smokeless tobacco: Never  Vaping Use   Vaping status: Former  Substance and Sexual Activity   Alcohol use: No    Alcohol/week: 0.0 standard drinks of alcohol   Drug use: Not Currently    Types: Marijuana   Sexual activity: Not on file  Other Topics Concern   Not on file  Social History Narrative   Lives with legal guardian, Greig Gust - to bring documentation of guardianship DOS.   Social Drivers of Health   Tobacco Use: High Risk (04/22/2024)   Patient History    Smoking Tobacco Use: Every Day    Smokeless Tobacco Use: Never    Passive Exposure: Not on file  Financial Resource Strain: Not on file  Food Insecurity: No Food Insecurity (11/23/2023)   Epic    Worried About Programme Researcher, Broadcasting/film/video in the Last Year: Never true    Ran Out of Food in the Last Year: Never true  Transportation Needs: No Transportation Needs (11/23/2023)   Epic    Lack of Transportation (Medical): No    Lack of Transportation (Non-Medical): No  Physical Activity: Not on file  Stress: Not on file  Social Connections: Not on file  Depression (PHQ2-9): High Risk (04/22/2024)   Depression (PHQ2-9)    PHQ-2 Score: 18  Alcohol Screen: Not on file  Housing: Unknown (04/01/2024)   Received from [facility]   Epic    Unable to Pay for Housing in the Last Year: Not on file    Number of Times Moved in the Last  Year: Not on file    At any time in the past 12 months, were you homeless or living in a shelter (including now)?: No  Utilities: Not At Risk (11/23/2023)   Epic    Threatened with loss of utilities: No  Health Literacy: Not on file   Past Surgical History:  Procedure Laterality Date   CYST EXCISION Left 01/10/2017   Procedure: EXCISION CYST LEFT CHEEK;  Surgeon: Claudius Kaplan, MD;  Location: [facility];  Service: General;  Laterality: Left;   Past Medical History:  Diagnosis Date   ADHD (attention deficit hyperactivity disorder)    no current med.   ADHD (attention deficit hyperactivity disorder) 11/28/2010   Adjustment disorder with depressed mood 09/07/2015   Anxiety    Benign skin cyst 12/2016   left cheek (face)   Epidermal cyst of face 12/17/2016   Foster care child 06/27/2017   Heart murmur    BP 108/65 (BP Location: Left Arm, Patient Position: Sitting, Cuff Size: Normal)   Pulse 88   Ht 5' 10.5 (1.791 m)   Wt 163 lb 3.2 oz (74 kg)   SpO2 94%  BMI 23.09 kg/m   Opioid Risk Score:   Fall Risk Score:  `1  Depression screen [facility] 2/9     04/22/2024    2:39 PM 12/20/2023    3:52 PM  Depression screen PHQ 2/9  Decreased Interest 3 2  Down, Depressed, Hopeless 2 1  PHQ - 2 Score 5 3  Altered sleeping 3 3  Tired, decreased energy 1 2  Change in appetite 3 3  Feeling bad or failure about yourself  3 1  Trouble concentrating 3 3  Moving slowly or fidgety/restless 0 0  Suicidal thoughts 0 0  PHQ-9 Score 18 15   Difficult doing work/chores Very difficult Very difficult     Data saved with a previous flowsheet row definition      Review of Systems  Constitutional:  Positive for fever.  Respiratory:  Positive for cough, shortness of breath and wheezing.        Recent respiratory infection, shortness of breath, wheezing, coughing  Musculoskeletal:  Positive for back pain.       Low back pain  Neurological:  Positive for dizziness, speech  difficulty and headaches.  Psychiatric/Behavioral:  Positive for confusion.        Confusion, depression, anxiety, memory issues  All other systems reviewed and are negative.      Objective:   Physical Exam   General: Appears fatigued oriented x 3, No apparent distress HEENT: Head is normocephalic, atraumatic, PERRLA, EOMI, sclera anicteric, oral mucosa pink and moist, dentition intact, ext ear canals clear,  Neck: Supple without JVD or lymphadenopathy Heart: Reg rate and rhythm. No murmurs rubs or gallops Chest: CTA bilaterally without wheezes, rales, or rhonchi; no distress Abdomen: Soft, non-tender, non-distended, bowel sounds positive. Extremities: No clubbing, cyanosis, or edema. Pulses are 2+ Psych: Pt's affect is appropriate. Pt is cooperative, a little flat Skin: Clean and intact without signs of breakdown Neuro:  Alert and oriented x 3. Normal insight and awareness. Intact Memory. Normal language and speech. Cranial nerve exam unremarkable. MMT: 5/5 BUE aND BLE. No PD, romberg negative. No nystagmus or vestibular signs. Sensory exam normal for light touch and pain in all 4 limbs. No limb ataxia or cerebellar signs. No abnormal tone appreciated. Serial 7's intact. Able to spell world forward and back.  Basic sequencing is intact.  Patient did struggle with conversational attention.  He repeated himself on a few different occasions and lost track of the topic.  He recalled 2 out of 3 words after 5 minutes.  His abstract thinking was appropriate.  He did not recall much in the way of current affairs. Musculoskeletal: Patient with normal neck range of motion.  I did not appreciate any spasm in the cervical or shoulder girdle musculature.      Assessment & Plan:  Assessment and Plan Assessment & Plan Postconcussion syndrome Persistent cognitive and concentration deficits, memory impairment, and worsened attention post-traumatic brain injury. Symptoms improving but above baseline  ADHD. Sleep disturbance exacerbating deficits. Anticipated further improvement with interventions. - Recommended neuropsychological evaluation for cognitive function and mood symptoms. - Advised continuation of physical therapy, nearing completion for neck and upper body. - Discussed potential need for work simulation or conditioning before return to work.  Cervicalgia secondary to traumatic injury Significant improvement in neck pain and range of motion post-physical therapy. Minimal pain, good range of motion, occasional flares managed with analgesics. - Recommended ibuprofen  800 mg every 8 hours as needed with food. - Advised continuation of physical therapy, nearing completion  for cervical rehabilitation.  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 Muscle relaxants avoided due to cognitive side effects. Prioritized sleep management for recovery. Sleep hygiene discussed - Prescribed trazodone  50 mg at bedtime for sleep initiation and maintenance. Call in one week if no improvement - I am hopeful that with improvement of his sleep will see some improvement in his related deficits.  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 Plan to address sleep first, as improved sleep may ameliorate symptoms. Further assessment post-sleep intervention and neuropsychological evaluation. - Recommended neuropsychological evaluation for depression and cognitive deficits. - Deferred antidepressant therapy pending sleep intervention response and evaluation.   Sixty minutes of face to face patient care time were spent during this visit. All questions were encouraged and answered. Follow up with me in 6 weeks. Reviewed plan with [facility] case manager    "

## 2024-04-22 NOTE — Patient Instructions (Signed)
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 We reviewed your progress with physical therapy and discussed further evaluations and treatments to help manage your symptoms.  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 We recommend a neuropsychological evaluation to assess your cognitive function and mood symptoms. Continue with physical therapy for your neck and upper body, and we may consider work simulation or conditioning before you return to work.  -CERVICALGIA SECONDARY TO TRAUMATIC INJURY: Cervicalgia is neck pain that can occur after a traumatic injury. You have shown significant improvement with physical therapy, but you may still experience occasional pain. We recommend taking ibuprofen  800 mg every 8 hours as needed with food and continuing with physical therapy until your cervical rehabilitation is complete.  -INSOMNIA SECONDARY TO BRAIN INJURY: Insomnia is difficulty sleeping, which can occur after a brain injury and contribute to cognitive impairment and mood disturbances. We have prescribed trazodone  50 mg at bedtime to help you initiate and maintain sleep.  -DEPRESSIVE SYMPTOMS SECONDARY TO BRAIN INJURY: Depressive symptoms can develop after a brain injury. We plan to address your sleep issues first, as improved sleep may help alleviate some of your depressive symptoms. We recommend a neuropsychological evaluation to further assess your depression and cognitive deficits. We will consider antidepressant therapy after evaluating your response to the sleep intervention.  INSTRUCTIONS: Please follow up for a neuropsychological evaluation to assess your cognitive function and mood  symptoms. Continue with physical therapy for your neck and upper body. Take ibuprofen  800 mg every 8 hours as needed with food for neck pain. Take trazodone  50 mg at bedtime to help with sleep. We will reassess your depressive symptoms after your sleep improves and following the neuropsychological evaluation.    Contains text generated by [employer].   "

## 2024-06-17 ENCOUNTER — Encounter: Admitting: Psychology

## 2024-06-24 ENCOUNTER — Encounter: Admitting: Physical Medicine & Rehabilitation
